# Patient Record
Sex: Male | Born: 1980
Health system: Southern US, Community
[De-identification: ages and names within clinical notes are randomized; demographics above are authoritative.]

## PROBLEM LIST (undated history)

## (undated) ENCOUNTER — Emergency Department (HOSPITAL_COMMUNITY): Admission: EM | Disposition: A | Discharge status: Home / Self Care | Payer: Self-pay

## (undated) DIAGNOSIS — Z21 Asymptomatic human immunodeficiency virus [HIV] infection status: Secondary | ICD-10-CM

## (undated) DIAGNOSIS — F640 Transsexualism: Secondary | ICD-10-CM

## (undated) DIAGNOSIS — B20 Human immunodeficiency virus [HIV] disease: Secondary | ICD-10-CM

## (undated) DIAGNOSIS — Z789 Other specified health status: Secondary | ICD-10-CM

---

## 2005-11-12 ENCOUNTER — Emergency Department (HOSPITAL_COMMUNITY): Admission: EM | Admit: 2005-11-12 | Discharge: 2005-11-13 | Payer: Self-pay | Admitting: Emergency Medicine

## 2006-03-29 ENCOUNTER — Encounter (INDEPENDENT_AMBULATORY_CARE_PROVIDER_SITE_OTHER): Payer: Self-pay | Admitting: *Deleted

## 2006-03-29 ENCOUNTER — Encounter: Admission: RE | Admit: 2006-03-29 | Discharge: 2006-03-29 | Payer: Self-pay | Admitting: Internal Medicine

## 2006-03-29 ENCOUNTER — Ambulatory Visit: Payer: Self-pay | Admitting: Internal Medicine

## 2006-03-29 LAB — CONVERTED CEMR LAB: HIV 1 RNA Quant: 115000 copies/mL

## 2006-04-23 ENCOUNTER — Ambulatory Visit: Payer: Self-pay | Admitting: Internal Medicine

## 2006-05-11 DIAGNOSIS — R599 Enlarged lymph nodes, unspecified: Secondary | ICD-10-CM | POA: Insufficient documentation

## 2006-05-11 DIAGNOSIS — B20 Human immunodeficiency virus [HIV] disease: Secondary | ICD-10-CM

## 2006-05-11 DIAGNOSIS — L089 Local infection of the skin and subcutaneous tissue, unspecified: Secondary | ICD-10-CM | POA: Insufficient documentation

## 2006-06-14 ENCOUNTER — Encounter (INDEPENDENT_AMBULATORY_CARE_PROVIDER_SITE_OTHER): Payer: Self-pay | Admitting: *Deleted

## 2006-06-14 LAB — CONVERTED CEMR LAB: CD4 Count: 0 microliters

## 2006-06-16 ENCOUNTER — Encounter: Admission: RE | Admit: 2006-06-16 | Discharge: 2006-06-16 | Payer: Self-pay | Admitting: Internal Medicine

## 2006-06-16 ENCOUNTER — Ambulatory Visit: Payer: Self-pay | Admitting: Internal Medicine

## 2006-06-16 LAB — CONVERTED CEMR LAB
AST: 22 units/L (ref 0–37)
Alkaline Phosphatase: 116 units/L (ref 39–117)
BUN: 12 mg/dL (ref 6–23)
Basophils Relative: 1 % (ref 0–1)
CD4 % Helper T Cell: 23 %
CD4 Count: 330 microliters
Calcium: 9.1 mg/dL (ref 8.4–10.5)
Creatinine, Ser: 0.89 mg/dL (ref 0.40–1.50)
Eosinophils Absolute: 0.3 10*3/uL (ref 0.0–0.7)
HCT: 44.8 % (ref 39.0–52.0)
Hemoglobin: 14.7 g/dL (ref 13.0–17.0)
MCHC: 32.8 g/dL (ref 30.0–36.0)
MCV: 98.7 fL (ref 78.0–100.0)
Monocytes Absolute: 0.6 10*3/uL (ref 0.2–0.7)
Monocytes Relative: 11 % (ref 3–11)
RBC: 4.54 M/uL (ref 4.22–5.81)

## 2006-06-22 ENCOUNTER — Telehealth (INDEPENDENT_AMBULATORY_CARE_PROVIDER_SITE_OTHER): Payer: Self-pay | Admitting: Infectious Diseases

## 2006-06-24 ENCOUNTER — Telehealth (INDEPENDENT_AMBULATORY_CARE_PROVIDER_SITE_OTHER): Payer: Self-pay | Admitting: *Deleted

## 2006-06-27 ENCOUNTER — Encounter (INDEPENDENT_AMBULATORY_CARE_PROVIDER_SITE_OTHER): Payer: Self-pay | Admitting: *Deleted

## 2006-07-02 ENCOUNTER — Ambulatory Visit: Payer: Self-pay | Admitting: Internal Medicine

## 2006-07-20 ENCOUNTER — Telehealth: Payer: Self-pay | Admitting: Internal Medicine

## 2006-08-16 ENCOUNTER — Encounter: Admission: RE | Admit: 2006-08-16 | Discharge: 2006-08-16 | Payer: Self-pay | Admitting: Internal Medicine

## 2006-08-16 ENCOUNTER — Ambulatory Visit: Payer: Self-pay | Admitting: Internal Medicine

## 2006-08-16 LAB — CONVERTED CEMR LAB
ALT: 26 units/L (ref 0–53)
Albumin: 4.3 g/dL (ref 3.5–5.2)
CO2: 24 meq/L (ref 19–32)
Calcium: 8.9 mg/dL (ref 8.4–10.5)
Chloride: 106 meq/L (ref 96–112)
Eosinophils Relative: 8 % — ABNORMAL HIGH (ref 0–5)
Glucose, Bld: 87 mg/dL (ref 70–99)
HCT: 41.5 % (ref 39.0–52.0)
HIV 1 RNA Quant: 797 copies/mL — ABNORMAL HIGH (ref <50)
HIV-1 RNA Quant, Log: 2.9 — ABNORMAL HIGH (ref <1.70)
Hemoglobin: 14 g/dL (ref 13.0–17.0)
Lymphocytes Relative: 34 % (ref 12–46)
Lymphs Abs: 1.7 10*3/uL (ref 0.7–3.3)
Neutro Abs: 2.4 10*3/uL (ref 1.7–7.7)
Platelets: 245 10*3/uL (ref 150–400)
Potassium: 4.1 meq/L (ref 3.5–5.3)
Sodium: 141 meq/L (ref 135–145)
Total Bilirubin: 0.7 mg/dL (ref 0.3–1.2)
Total Protein: 7.5 g/dL (ref 6.0–8.3)
WBC: 5 10*3/uL (ref 4.0–10.5)

## 2006-08-17 ENCOUNTER — Telehealth: Payer: Self-pay | Admitting: Internal Medicine

## 2006-08-31 ENCOUNTER — Ambulatory Visit: Payer: Self-pay | Admitting: Internal Medicine

## 2006-08-31 DIAGNOSIS — J309 Allergic rhinitis, unspecified: Secondary | ICD-10-CM | POA: Insufficient documentation

## 2006-09-17 ENCOUNTER — Telehealth: Payer: Self-pay | Admitting: Internal Medicine

## 2006-10-15 ENCOUNTER — Telehealth: Payer: Self-pay | Admitting: Internal Medicine

## 2006-10-29 ENCOUNTER — Telehealth: Payer: Self-pay | Admitting: Internal Medicine

## 2006-11-12 ENCOUNTER — Telehealth: Payer: Self-pay | Admitting: Internal Medicine

## 2006-11-18 ENCOUNTER — Ambulatory Visit: Payer: Self-pay | Admitting: Internal Medicine

## 2006-11-18 ENCOUNTER — Encounter: Admission: RE | Admit: 2006-11-18 | Discharge: 2006-11-18 | Payer: Self-pay | Admitting: Internal Medicine

## 2006-11-18 LAB — CONVERTED CEMR LAB
ALT: 27 units/L (ref 0–53)
AST: 18 units/L (ref 0–37)
Albumin: 4.2 g/dL (ref 3.5–5.2)
Alkaline Phosphatase: 133 units/L — ABNORMAL HIGH (ref 39–117)
Basophils Absolute: 0 10*3/uL (ref 0.0–0.1)
Basophils Relative: 1 % (ref 0–1)
Calcium: 8.8 mg/dL (ref 8.4–10.5)
Chloride: 106 meq/L (ref 96–112)
Eosinophils Absolute: 0.5 10*3/uL (ref 0.0–0.7)
MCHC: 33.3 g/dL (ref 30.0–36.0)
MCV: 102.7 fL — ABNORMAL HIGH (ref 78.0–100.0)
Neutro Abs: 3 10*3/uL (ref 1.7–7.7)
Neutrophils Relative %: 52 % (ref 43–77)
Platelets: 235 10*3/uL (ref 150–400)
Potassium: 4 meq/L (ref 3.5–5.3)
RDW: 11.5 % (ref 11.5–14.0)

## 2006-11-20 ENCOUNTER — Emergency Department (HOSPITAL_COMMUNITY): Admission: EM | Admit: 2006-11-20 | Discharge: 2006-11-20 | Payer: Self-pay | Admitting: Emergency Medicine

## 2006-11-23 ENCOUNTER — Telehealth: Payer: Self-pay | Admitting: Internal Medicine

## 2006-11-24 ENCOUNTER — Encounter: Payer: Self-pay | Admitting: Internal Medicine

## 2006-12-03 ENCOUNTER — Ambulatory Visit: Payer: Self-pay | Admitting: Internal Medicine

## 2006-12-07 ENCOUNTER — Telehealth: Payer: Self-pay | Admitting: Internal Medicine

## 2006-12-08 ENCOUNTER — Telehealth: Payer: Self-pay | Admitting: Internal Medicine

## 2007-01-05 ENCOUNTER — Telehealth: Payer: Self-pay | Admitting: Internal Medicine

## 2007-01-12 ENCOUNTER — Ambulatory Visit: Payer: Self-pay | Admitting: Internal Medicine

## 2007-01-12 ENCOUNTER — Encounter: Admission: RE | Admit: 2007-01-12 | Discharge: 2007-01-12 | Payer: Self-pay | Admitting: Internal Medicine

## 2007-01-12 LAB — CONVERTED CEMR LAB
AST: 20 units/L (ref 0–37)
Alkaline Phosphatase: 132 units/L — ABNORMAL HIGH (ref 39–117)
BUN: 15 mg/dL (ref 6–23)
Basophils Relative: 0 % (ref 0–1)
Eosinophils Absolute: 0.3 10*3/uL (ref 0.0–0.7)
Eosinophils Relative: 5 % (ref 0–5)
Glucose, Bld: 83 mg/dL (ref 70–99)
HCT: 41.6 % (ref 39.0–52.0)
HIV-1 RNA Quant, Log: 2.23 — ABNORMAL HIGH (ref <1.70)
Lymphs Abs: 1.4 10*3/uL (ref 0.7–3.3)
MCHC: 33.2 g/dL (ref 30.0–36.0)
MCV: 106.4 fL — ABNORMAL HIGH (ref 78.0–100.0)
Monocytes Relative: 8 % (ref 3–11)
Neutrophils Relative %: 60 % (ref 43–77)
Platelets: 268 10*3/uL (ref 150–400)
Potassium: 3.9 meq/L (ref 3.5–5.3)
RBC: 3.91 M/uL — ABNORMAL LOW (ref 4.22–5.81)
Total Bilirubin: 0.4 mg/dL (ref 0.3–1.2)
WBC: 5.3 10*3/uL (ref 4.0–10.5)

## 2007-01-26 ENCOUNTER — Ambulatory Visit: Payer: Self-pay | Admitting: Internal Medicine

## 2007-01-26 DIAGNOSIS — K029 Dental caries, unspecified: Secondary | ICD-10-CM | POA: Insufficient documentation

## 2007-02-08 ENCOUNTER — Telehealth: Payer: Self-pay | Admitting: Internal Medicine

## 2007-03-07 ENCOUNTER — Telehealth: Payer: Self-pay | Admitting: Internal Medicine

## 2007-04-07 ENCOUNTER — Telehealth: Payer: Self-pay | Admitting: Internal Medicine

## 2007-04-18 ENCOUNTER — Encounter: Payer: Self-pay | Admitting: Internal Medicine

## 2007-04-19 ENCOUNTER — Encounter (INDEPENDENT_AMBULATORY_CARE_PROVIDER_SITE_OTHER): Payer: Self-pay | Admitting: *Deleted

## 2007-04-29 ENCOUNTER — Telehealth: Payer: Self-pay | Admitting: Internal Medicine

## 2007-05-02 ENCOUNTER — Encounter: Admission: RE | Admit: 2007-05-02 | Discharge: 2007-05-02 | Payer: Self-pay | Admitting: Internal Medicine

## 2007-05-02 ENCOUNTER — Ambulatory Visit: Payer: Self-pay | Admitting: Internal Medicine

## 2007-05-02 LAB — CONVERTED CEMR LAB
BUN: 12 mg/dL (ref 6–23)
CO2: 22 meq/L (ref 19–32)
Eosinophils Absolute: 0.4 10*3/uL (ref 0.0–0.7)
Eosinophils Relative: 6 % — ABNORMAL HIGH (ref 0–5)
Glucose, Bld: 84 mg/dL (ref 70–99)
HCT: 39.4 % (ref 39.0–52.0)
HIV 1 RNA Quant: 119 copies/mL — ABNORMAL HIGH (ref <50)
HIV-1 RNA Quant, Log: 2.08 — ABNORMAL HIGH (ref <1.70)
Hemoglobin: 14 g/dL (ref 13.0–17.0)
Lymphocytes Relative: 33 % (ref 12–46)
Lymphs Abs: 1.9 10*3/uL (ref 0.7–4.0)
MCV: 119.8 fL — ABNORMAL HIGH (ref 78.0–100.0)
Monocytes Absolute: 0.5 10*3/uL (ref 0.1–1.0)
Monocytes Relative: 8 % (ref 3–12)
Platelets: 284 10*3/uL (ref 150–400)
RBC: 3.29 M/uL — ABNORMAL LOW (ref 4.22–5.81)
Sodium: 140 meq/L (ref 135–145)
Total Bilirubin: 0.5 mg/dL (ref 0.3–1.2)
Total Protein: 7.6 g/dL (ref 6.0–8.3)
WBC: 5.9 10*3/uL (ref 4.0–10.5)

## 2007-05-10 ENCOUNTER — Telehealth: Payer: Self-pay | Admitting: Internal Medicine

## 2007-05-17 ENCOUNTER — Ambulatory Visit: Payer: Self-pay | Admitting: Internal Medicine

## 2007-05-17 ENCOUNTER — Telehealth: Payer: Self-pay | Admitting: Internal Medicine

## 2007-06-09 ENCOUNTER — Telehealth: Payer: Self-pay | Admitting: Internal Medicine

## 2007-06-10 ENCOUNTER — Encounter (INDEPENDENT_AMBULATORY_CARE_PROVIDER_SITE_OTHER): Payer: Self-pay | Admitting: *Deleted

## 2007-06-10 ENCOUNTER — Telehealth: Payer: Self-pay | Admitting: Internal Medicine

## 2007-06-14 ENCOUNTER — Ambulatory Visit: Payer: Self-pay | Admitting: Internal Medicine

## 2007-06-14 DIAGNOSIS — B351 Tinea unguium: Secondary | ICD-10-CM

## 2007-06-22 ENCOUNTER — Telehealth (INDEPENDENT_AMBULATORY_CARE_PROVIDER_SITE_OTHER): Payer: Self-pay | Admitting: *Deleted

## 2007-07-01 ENCOUNTER — Encounter (INDEPENDENT_AMBULATORY_CARE_PROVIDER_SITE_OTHER): Payer: Self-pay | Admitting: *Deleted

## 2007-07-05 ENCOUNTER — Telehealth: Payer: Self-pay | Admitting: Internal Medicine

## 2007-08-04 ENCOUNTER — Telehealth (INDEPENDENT_AMBULATORY_CARE_PROVIDER_SITE_OTHER): Payer: Self-pay | Admitting: *Deleted

## 2007-09-02 ENCOUNTER — Telehealth (INDEPENDENT_AMBULATORY_CARE_PROVIDER_SITE_OTHER): Payer: Self-pay | Admitting: *Deleted

## 2007-09-23 ENCOUNTER — Ambulatory Visit: Payer: Self-pay | Admitting: Internal Medicine

## 2007-09-23 ENCOUNTER — Encounter: Admission: RE | Admit: 2007-09-23 | Discharge: 2007-09-23 | Payer: Self-pay | Admitting: Internal Medicine

## 2007-09-23 LAB — CONVERTED CEMR LAB
ALT: 27 units/L (ref 0–53)
Basophils Absolute: 0 10*3/uL (ref 0.0–0.1)
CO2: 24 meq/L (ref 19–32)
Calcium: 8.9 mg/dL (ref 8.4–10.5)
Chloride: 105 meq/L (ref 96–112)
HIV 1 RNA Quant: 342 copies/mL — ABNORMAL HIGH (ref <50)
Hemoglobin: 12.8 g/dL — ABNORMAL LOW (ref 13.0–17.0)
Lymphocytes Relative: 29 % (ref 12–46)
Lymphs Abs: 1.9 10*3/uL (ref 0.7–4.0)
Monocytes Absolute: 0.4 10*3/uL (ref 0.1–1.0)
Neutro Abs: 3.8 10*3/uL (ref 1.7–7.7)
Platelets: 292 10*3/uL (ref 150–400)
RDW: 11.6 % (ref 11.5–15.5)
Sodium: 140 meq/L (ref 135–145)
Total Protein: 7.3 g/dL (ref 6.0–8.3)
WBC: 6.6 10*3/uL (ref 4.0–10.5)

## 2007-10-03 ENCOUNTER — Telehealth (INDEPENDENT_AMBULATORY_CARE_PROVIDER_SITE_OTHER): Payer: Self-pay | Admitting: *Deleted

## 2007-10-07 ENCOUNTER — Ambulatory Visit: Payer: Self-pay | Admitting: Internal Medicine

## 2007-11-01 ENCOUNTER — Telehealth (INDEPENDENT_AMBULATORY_CARE_PROVIDER_SITE_OTHER): Payer: Self-pay | Admitting: *Deleted

## 2007-11-30 ENCOUNTER — Telehealth (INDEPENDENT_AMBULATORY_CARE_PROVIDER_SITE_OTHER): Payer: Self-pay | Admitting: *Deleted

## 2007-12-01 ENCOUNTER — Telehealth: Payer: Self-pay | Admitting: Internal Medicine

## 2008-01-02 ENCOUNTER — Telehealth (INDEPENDENT_AMBULATORY_CARE_PROVIDER_SITE_OTHER): Payer: Self-pay | Admitting: *Deleted

## 2008-01-05 ENCOUNTER — Ambulatory Visit: Payer: Self-pay | Admitting: Internal Medicine

## 2008-01-05 ENCOUNTER — Telehealth: Payer: Self-pay | Admitting: Internal Medicine

## 2008-01-05 LAB — CONVERTED CEMR LAB
ALT: 21 units/L (ref 0–53)
AST: 15 units/L (ref 0–37)
Basophils Absolute: 0 10*3/uL (ref 0.0–0.1)
Basophils Relative: 0 % (ref 0–1)
Calcium: 9 mg/dL (ref 8.4–10.5)
Chloride: 105 meq/L (ref 96–112)
Creatinine, Ser: 0.88 mg/dL (ref 0.40–1.50)
HIV-1 RNA Quant, Log: 2.41 — ABNORMAL HIGH (ref <1.70)
MCHC: 33 g/dL (ref 30.0–36.0)
Neutro Abs: 6.5 10*3/uL (ref 1.7–7.7)
Neutrophils Relative %: 61 % (ref 43–77)
Platelets: 267 10*3/uL (ref 150–400)
Potassium: 3.6 meq/L (ref 3.5–5.3)
RDW: 11.9 % (ref 11.5–15.5)

## 2008-01-06 ENCOUNTER — Ambulatory Visit: Payer: Self-pay | Admitting: Internal Medicine

## 2008-01-06 DIAGNOSIS — L0233 Carbuncle of buttock: Secondary | ICD-10-CM

## 2008-01-20 ENCOUNTER — Ambulatory Visit: Payer: Self-pay | Admitting: Internal Medicine

## 2008-01-26 ENCOUNTER — Telehealth (INDEPENDENT_AMBULATORY_CARE_PROVIDER_SITE_OTHER): Payer: Self-pay | Admitting: *Deleted

## 2008-02-29 ENCOUNTER — Telehealth (INDEPENDENT_AMBULATORY_CARE_PROVIDER_SITE_OTHER): Payer: Self-pay | Admitting: *Deleted

## 2008-03-28 ENCOUNTER — Telehealth (INDEPENDENT_AMBULATORY_CARE_PROVIDER_SITE_OTHER): Payer: Self-pay | Admitting: *Deleted

## 2008-04-26 ENCOUNTER — Telehealth (INDEPENDENT_AMBULATORY_CARE_PROVIDER_SITE_OTHER): Payer: Self-pay | Admitting: *Deleted

## 2008-04-30 ENCOUNTER — Encounter (INDEPENDENT_AMBULATORY_CARE_PROVIDER_SITE_OTHER): Payer: Self-pay | Admitting: Licensed Clinical Social Worker

## 2008-05-04 ENCOUNTER — Ambulatory Visit: Payer: Self-pay | Admitting: Internal Medicine

## 2008-05-04 LAB — CONVERTED CEMR LAB
BUN: 14 mg/dL (ref 6–23)
Basophils Absolute: 0 10*3/uL (ref 0.0–0.1)
CO2: 24 meq/L (ref 19–32)
Creatinine, Ser: 0.99 mg/dL (ref 0.40–1.50)
Eosinophils Relative: 10 % — ABNORMAL HIGH (ref 0–5)
Glucose, Bld: 88 mg/dL (ref 70–99)
HCT: 40.4 % (ref 39.0–52.0)
HIV 1 RNA Quant: 1400 copies/mL — ABNORMAL HIGH (ref <48)
HIV-1 RNA Quant, Log: 3.15 — ABNORMAL HIGH (ref <1.68)
Hemoglobin: 13.6 g/dL (ref 13.0–17.0)
Lymphocytes Relative: 41 % (ref 12–46)
Lymphs Abs: 2.7 10*3/uL (ref 0.7–4.0)
Monocytes Absolute: 0.5 10*3/uL (ref 0.1–1.0)
Monocytes Relative: 8 % (ref 3–12)
RDW: 11.2 % — ABNORMAL LOW (ref 11.5–15.5)
Total Bilirubin: 0.4 mg/dL (ref 0.3–1.2)

## 2008-05-15 ENCOUNTER — Telehealth (INDEPENDENT_AMBULATORY_CARE_PROVIDER_SITE_OTHER): Payer: Self-pay | Admitting: Licensed Clinical Social Worker

## 2008-05-15 ENCOUNTER — Emergency Department (HOSPITAL_COMMUNITY): Admission: EM | Admit: 2008-05-15 | Discharge: 2008-05-15 | Payer: Self-pay | Admitting: Emergency Medicine

## 2008-05-23 ENCOUNTER — Ambulatory Visit: Payer: Self-pay | Admitting: Internal Medicine

## 2008-05-23 LAB — CONVERTED CEMR LAB

## 2008-05-24 ENCOUNTER — Telehealth (INDEPENDENT_AMBULATORY_CARE_PROVIDER_SITE_OTHER): Payer: Self-pay | Admitting: *Deleted

## 2008-06-13 ENCOUNTER — Ambulatory Visit: Payer: Self-pay | Admitting: Internal Medicine

## 2008-06-19 ENCOUNTER — Telehealth (INDEPENDENT_AMBULATORY_CARE_PROVIDER_SITE_OTHER): Payer: Self-pay | Admitting: *Deleted

## 2008-06-20 ENCOUNTER — Encounter (INDEPENDENT_AMBULATORY_CARE_PROVIDER_SITE_OTHER): Payer: Self-pay | Admitting: *Deleted

## 2008-07-11 ENCOUNTER — Encounter (INDEPENDENT_AMBULATORY_CARE_PROVIDER_SITE_OTHER): Payer: Self-pay | Admitting: *Deleted

## 2008-07-23 ENCOUNTER — Telehealth (INDEPENDENT_AMBULATORY_CARE_PROVIDER_SITE_OTHER): Payer: Self-pay | Admitting: *Deleted

## 2008-07-26 ENCOUNTER — Ambulatory Visit: Payer: Self-pay | Admitting: Internal Medicine

## 2008-07-26 LAB — CONVERTED CEMR LAB
AST: 12 units/L (ref 0–37)
Albumin: 4.4 g/dL (ref 3.5–5.2)
Alkaline Phosphatase: 106 units/L (ref 39–117)
Eosinophils Absolute: 0.4 10*3/uL (ref 0.0–0.7)
Eosinophils Relative: 6 % — ABNORMAL HIGH (ref 0–5)
HCT: 40.1 % (ref 39.0–52.0)
Hemoglobin: 13.8 g/dL (ref 13.0–17.0)
Lymphs Abs: 1.9 10*3/uL (ref 0.7–4.0)
MCV: 109.6 fL — ABNORMAL HIGH (ref 78.0–100.0)
Monocytes Absolute: 0.5 10*3/uL (ref 0.1–1.0)
Platelets: 240 10*3/uL (ref 150–400)
Potassium: 3.8 meq/L (ref 3.5–5.3)
RDW: 11.7 % (ref 11.5–15.5)
Sodium: 139 meq/L (ref 135–145)
Total Protein: 7.2 g/dL (ref 6.0–8.3)

## 2008-08-15 ENCOUNTER — Telehealth (INDEPENDENT_AMBULATORY_CARE_PROVIDER_SITE_OTHER): Payer: Self-pay | Admitting: *Deleted

## 2008-08-24 ENCOUNTER — Ambulatory Visit: Payer: Self-pay | Admitting: Internal Medicine

## 2008-09-11 ENCOUNTER — Telehealth (INDEPENDENT_AMBULATORY_CARE_PROVIDER_SITE_OTHER): Payer: Self-pay | Admitting: *Deleted

## 2008-09-13 ENCOUNTER — Emergency Department (HOSPITAL_COMMUNITY): Admission: EM | Admit: 2008-09-13 | Discharge: 2008-09-13 | Payer: Self-pay | Admitting: Emergency Medicine

## 2008-10-05 ENCOUNTER — Ambulatory Visit: Payer: Self-pay | Admitting: Internal Medicine

## 2008-10-05 LAB — CONVERTED CEMR LAB
ALT: 9 units/L (ref 0–53)
Albumin: 4.2 g/dL (ref 3.5–5.2)
Basophils Absolute: 0 10*3/uL (ref 0.0–0.1)
Basophils Relative: 0 % (ref 0–1)
CO2: 24 meq/L (ref 19–32)
Calcium: 9 mg/dL (ref 8.4–10.5)
Chloride: 107 meq/L (ref 96–112)
GFR calc Af Amer: >60 mL/min (ref >60)
GFR calc non Af Amer: >60 mL/min (ref >60)
Glucose, Bld: 90 mg/dL (ref 70–99)
HIV 1 RNA Quant: 130 copies/mL — ABNORMAL HIGH (ref <48)
HIV-1 RNA Quant, Log: 2.11 — ABNORMAL HIGH (ref <1.68)
Hemoglobin: 13.5 g/dL (ref 13.0–17.0)
Lymphocytes Relative: 29 % (ref 12–46)
MCHC: 32.3 g/dL (ref 30.0–36.0)
Neutro Abs: 3.9 10*3/uL (ref 1.7–7.7)
Neutrophils Relative %: 60 % (ref 43–77)
Platelets: 236 10*3/uL (ref 150–400)
RDW: 12.1 % (ref 11.5–15.5)
Sodium: 139 meq/L (ref 135–145)
Total Bilirubin: 0.8 mg/dL (ref 0.3–1.2)
Total Protein: 7.1 g/dL (ref 6.0–8.3)

## 2008-10-16 ENCOUNTER — Telehealth (INDEPENDENT_AMBULATORY_CARE_PROVIDER_SITE_OTHER): Payer: Self-pay | Admitting: *Deleted

## 2008-10-19 ENCOUNTER — Ambulatory Visit: Payer: Self-pay | Admitting: Internal Medicine

## 2008-10-19 DIAGNOSIS — M25559 Pain in unspecified hip: Secondary | ICD-10-CM

## 2008-11-12 ENCOUNTER — Telehealth (INDEPENDENT_AMBULATORY_CARE_PROVIDER_SITE_OTHER): Payer: Self-pay | Admitting: *Deleted

## 2008-12-07 ENCOUNTER — Telehealth (INDEPENDENT_AMBULATORY_CARE_PROVIDER_SITE_OTHER): Payer: Self-pay | Admitting: *Deleted

## 2009-01-08 ENCOUNTER — Telehealth (INDEPENDENT_AMBULATORY_CARE_PROVIDER_SITE_OTHER): Payer: Self-pay | Admitting: *Deleted

## 2009-01-24 ENCOUNTER — Ambulatory Visit: Payer: Self-pay | Admitting: Internal Medicine

## 2009-01-24 LAB — CONVERTED CEMR LAB
ALT: 11 units/L (ref 0–53)
AST: 13 units/L (ref 0–37)
BUN: 10 mg/dL (ref 6–23)
Basophils Absolute: 0 10*3/uL (ref 0.0–0.1)
Calcium: 9.2 mg/dL (ref 8.4–10.5)
Chloride: 106 meq/L (ref 96–112)
Creatinine, Ser: 1.02 mg/dL (ref 0.40–1.50)
Eosinophils Absolute: 0.6 10*3/uL (ref 0.0–0.7)
Eosinophils Relative: 6 % — ABNORMAL HIGH (ref 0–5)
HCT: 44.7 % (ref 39.0–52.0)
HIV-1 RNA Quant, Log: 1.87 — ABNORMAL HIGH (ref <1.68)
Hemoglobin: 14.4 g/dL (ref 13.0–17.0)
Lymphocytes Relative: 24 % (ref 12–46)
Lymphs Abs: 2.4 10*3/uL (ref 0.7–4.0)
MCV: 105.4 fL — ABNORMAL HIGH (ref >=78.0)
Monocytes Absolute: 0.9 10*3/uL (ref 0.1–1.0)
Platelets: 239 10*3/uL (ref 150–400)
RDW: 11.8 % (ref 11.5–15.5)
Total Bilirubin: 0.4 mg/dL (ref 0.3–1.2)

## 2009-02-06 ENCOUNTER — Telehealth (INDEPENDENT_AMBULATORY_CARE_PROVIDER_SITE_OTHER): Payer: Self-pay | Admitting: *Deleted

## 2009-02-06 ENCOUNTER — Ambulatory Visit: Payer: Self-pay | Admitting: Internal Medicine

## 2009-03-05 ENCOUNTER — Telehealth (INDEPENDENT_AMBULATORY_CARE_PROVIDER_SITE_OTHER): Payer: Self-pay | Admitting: *Deleted

## 2009-04-08 ENCOUNTER — Telehealth (INDEPENDENT_AMBULATORY_CARE_PROVIDER_SITE_OTHER): Payer: Self-pay | Admitting: *Deleted

## 2009-05-02 ENCOUNTER — Ambulatory Visit: Payer: Self-pay | Admitting: Internal Medicine

## 2009-05-03 ENCOUNTER — Encounter: Payer: Self-pay | Admitting: Internal Medicine

## 2009-05-03 LAB — CONVERTED CEMR LAB
AST: 17 units/L (ref 0–37)
Albumin: 4.6 g/dL (ref 3.5–5.2)
BUN: 19 mg/dL (ref 6–23)
CO2: 24 meq/L (ref 19–32)
Calcium: 9.4 mg/dL (ref 8.4–10.5)
Chloride: 107 meq/L (ref 96–112)
Cholesterol: 193 mg/dL (ref 0–200)
Eosinophils Absolute: 0.4 10*3/uL (ref 0.0–0.7)
Eosinophils Relative: 6 % — ABNORMAL HIGH (ref 0–5)
Glucose, Bld: 90 mg/dL (ref 70–99)
HCT: 43.2 % (ref 39.0–52.0)
HDL: 45 mg/dL (ref >39)
Hemoglobin: 14.3 g/dL (ref 13.0–17.0)
Lymphocytes Relative: 28 % (ref 12–46)
Lymphs Abs: 2 10*3/uL (ref 0.7–4.0)
MCV: 105.9 fL — ABNORMAL HIGH (ref >=78.0)
Monocytes Absolute: 0.5 10*3/uL (ref 0.1–1.0)
Monocytes Relative: 7 % (ref 3–12)
Potassium: 4.3 meq/L (ref 3.5–5.3)
Triglycerides: 53 mg/dL (ref <150)
WBC: 7.1 10*3/uL (ref 4.0–10.5)

## 2009-05-06 ENCOUNTER — Telehealth (INDEPENDENT_AMBULATORY_CARE_PROVIDER_SITE_OTHER): Payer: Self-pay | Admitting: *Deleted

## 2009-05-17 ENCOUNTER — Ambulatory Visit: Payer: Self-pay | Admitting: Internal Medicine

## 2009-05-20 ENCOUNTER — Telehealth (INDEPENDENT_AMBULATORY_CARE_PROVIDER_SITE_OTHER): Payer: Self-pay | Admitting: Licensed Clinical Social Worker

## 2009-05-20 LAB — CONVERTED CEMR LAB
Chlamydia, Swab/Urine, PCR: NEGATIVE
GC Probe Amp, Urine: NEGATIVE

## 2009-06-01 ENCOUNTER — Telehealth (INDEPENDENT_AMBULATORY_CARE_PROVIDER_SITE_OTHER): Payer: Self-pay | Admitting: *Deleted

## 2009-07-01 ENCOUNTER — Telehealth (INDEPENDENT_AMBULATORY_CARE_PROVIDER_SITE_OTHER): Payer: Self-pay | Admitting: *Deleted

## 2009-07-10 ENCOUNTER — Encounter (INDEPENDENT_AMBULATORY_CARE_PROVIDER_SITE_OTHER): Payer: Self-pay | Admitting: *Deleted

## 2009-08-15 ENCOUNTER — Encounter (INDEPENDENT_AMBULATORY_CARE_PROVIDER_SITE_OTHER): Payer: Self-pay | Admitting: *Deleted

## 2009-08-15 ENCOUNTER — Ambulatory Visit: Payer: Self-pay | Admitting: Internal Medicine

## 2009-08-15 LAB — CONVERTED CEMR LAB
ALT: 12 units/L (ref 0–53)
Alkaline Phosphatase: 93 units/L (ref 39–117)
Basophils Absolute: 0 10*3/uL (ref 0.0–0.1)
Basophils Relative: 1 % (ref 0–1)
CO2: 26 meq/L (ref 19–32)
MCHC: 32.6 g/dL (ref 30.0–36.0)
Monocytes Relative: 10 % (ref 3–12)
Neutro Abs: 3.9 10*3/uL (ref 1.7–7.7)
Neutrophils Relative %: 57 % (ref 43–77)
RBC: 4.25 M/uL (ref 4.22–5.81)
Sodium: 139 meq/L (ref 135–145)
Total Bilirubin: 0.4 mg/dL (ref 0.3–1.2)
Total Protein: 7.5 g/dL (ref 6.0–8.3)

## 2009-08-16 ENCOUNTER — Telehealth: Payer: Self-pay | Admitting: Internal Medicine

## 2009-08-30 ENCOUNTER — Ambulatory Visit: Payer: Self-pay | Admitting: Internal Medicine

## 2009-08-30 DIAGNOSIS — R059 Cough, unspecified: Secondary | ICD-10-CM | POA: Insufficient documentation

## 2009-08-30 DIAGNOSIS — R05 Cough: Secondary | ICD-10-CM | POA: Insufficient documentation

## 2009-09-11 ENCOUNTER — Telehealth: Payer: Self-pay | Admitting: Internal Medicine

## 2009-10-17 ENCOUNTER — Telehealth: Payer: Self-pay | Admitting: Internal Medicine

## 2009-10-22 ENCOUNTER — Telehealth: Payer: Self-pay | Admitting: Internal Medicine

## 2009-11-15 ENCOUNTER — Telehealth: Payer: Self-pay | Admitting: Internal Medicine

## 2009-12-03 ENCOUNTER — Ambulatory Visit: Payer: Self-pay | Admitting: Internal Medicine

## 2009-12-03 LAB — CONVERTED CEMR LAB
ALT: 9 units/L (ref 0–53)
AST: 12 units/L (ref 0–37)
Basophils Absolute: 0 10*3/uL (ref 0.0–0.1)
Basophils Relative: 1 % (ref 0–1)
CO2: 20 meq/L (ref 19–32)
Creatinine, Ser: 1 mg/dL (ref 0.40–1.50)
Eosinophils Relative: 5 % (ref 0–5)
HCT: 42.5 % (ref 39.0–52.0)
HIV 1 RNA Quant: <48 copies/mL (ref <48)
Hemoglobin: 14 g/dL (ref 13.0–17.0)
MCHC: 32.9 g/dL (ref 30.0–36.0)
Monocytes Absolute: 0.7 10*3/uL (ref 0.1–1.0)
RDW: 11.6 % (ref 11.5–15.5)
Total Bilirubin: 0.5 mg/dL (ref 0.3–1.2)

## 2009-12-17 ENCOUNTER — Telehealth (INDEPENDENT_AMBULATORY_CARE_PROVIDER_SITE_OTHER): Payer: Self-pay | Admitting: *Deleted

## 2010-01-03 ENCOUNTER — Ambulatory Visit: Payer: Self-pay | Admitting: Internal Medicine

## 2010-01-03 DIAGNOSIS — K602 Anal fissure, unspecified: Secondary | ICD-10-CM | POA: Insufficient documentation

## 2010-01-10 ENCOUNTER — Telehealth: Payer: Self-pay | Admitting: Internal Medicine

## 2010-02-10 ENCOUNTER — Telehealth (INDEPENDENT_AMBULATORY_CARE_PROVIDER_SITE_OTHER): Payer: Self-pay | Admitting: *Deleted

## 2010-02-14 ENCOUNTER — Telehealth (INDEPENDENT_AMBULATORY_CARE_PROVIDER_SITE_OTHER): Payer: Self-pay | Admitting: *Deleted

## 2010-03-07 ENCOUNTER — Telehealth (INDEPENDENT_AMBULATORY_CARE_PROVIDER_SITE_OTHER): Payer: Self-pay | Admitting: *Deleted

## 2010-05-20 NOTE — Miscellaneous (Signed)
Summary: clinical update/ryan white [facility] appr til 07/19/10  Clinical Lists Changes  Observations: Added new observation of AIDSDAP: Yes 2011 (07/10/2009 8:40)

## 2010-05-20 NOTE — Assessment & Plan Note (Signed)
Summary: f/u ov/vs   CC:  follow-up visit and lab results.  History of Present Illness: Pt c/o pain in rectal area with some blood for the last few days. No mised doses of his HIV meds.  Preventive Screening-Counseling & Management  Alcohol-Tobacco     Alcohol drinks/day: one a day     Alcohol type: beer     Smoking Status: current     Smoking Cessation Counseling: yes     Smoke Cessation Stage: contemplative     Packs/Day: 0.5     Year Started: 10     Passive Smoke Exposure: no  Caffeine-Diet-Exercise     Caffeine use/day: occasional soda     Does Patient Exercise: yes     Type of exercise: walk     Exercise (avg: min/session): >60     Times/week: 5  Hep-HIV-STD-Contraception     HIV Risk: no risk noted  Safety-Violence-Falls     Seat Belt Use: yes      Sexual History:  dating.        Drug Use:  never.    Comments: pt given condoms   Updated Prior Medication List: PROMETHAZINE HCL 25 MG TABS (PROMETHAZINE HCL) 1 every 4-6 hours as needed nausea NORVIR 100 MG TABS (RITONAVIR) Take 1 tablet by mouth once a day PREZISTA 400 MG TABS (DARUNAVIR ETHANOLATE) Take 2 tablest by mouth once a day TRUVADA 200-300 MG TABS (EMTRICITABINE-TENOFOVIR) Take 1 tablet by mouth once a day DOXYCYCLINE HYCLATE 100 MG CAPS (DOXYCYCLINE HYCLATE) Take 1 capsule by mouth two times a day  Current Allergies (reviewed today): No known allergies  Past History:  Past Medical History: Last updated: 05/11/2006 HIV disease Recurrent skin infection Adenopathy  Review of Systems  The patient denies anorexia, fever, and weight loss.    Vital Signs:  Patient profile:   30 year old male Height:      72 inches (182.88 cm) Weight:      176.0 pounds (80 kg) BMI:     23.96 Temp:     98.2 degrees F (36.78 degrees C) oral Pulse rate:   84 / minute BP sitting:   115 / 61  (right arm)  Vitals Entered By: [name] CMA [facility]) (January 03, 2010 3:22 PM) CC: follow-up visit, lab  results Is Patient Diabetic? No Pain Assessment Patient in pain? no      Nutritional Status BMI of 19 -24 = normal Nutritional Status Detail appetite "good"  Does patient need assistance? Functional Status Self care Ambulation Normal Comments no missed doses of meds per patient   Physical Exam  General:  alert, well-developed, well-nourished, and well-hydrated.   Head:  normocephalic and atraumatic.   Lungs:  normal breath sounds.   Rectal:  anal fissure.      Impression & Recommendations:  Problem # 1:  HIV DISEASE (ICD-042) Pt.s most recent CD4ct was  640 and VL <48 .  Pt instructed to continue the current antiretroviral regimen.  Pt encouraged to take medication regularly and not miss doses.  Pt will f/u in 3 months for repeat blood work and will see me 2 weeks later. Influenza vaccine given.  Diagnostics Reviewed:  HIV: HIV positive - not AIDS (10/19/2008)   CD4: 640 (12/04/2009)   CD4 %: 23 (06/16/2006) WBC: 8.5 (12/03/2009)   Hgb: 14.0 (12/03/2009)   HCT: 42.5 (12/03/2009)   Platelets: 254 (12/03/2009) HIV genotype: See Comment (05/23/2008)   HIV-1 RNA: <48 copies/mL (12/03/2009)   HBSAg: NO (06/14/2006)  Problem # 2:  ANAL FISSURE (ICD-565.0) warm baths doxycycline for 10 days call if not improving  Medications Added to Medication List This Visit: 1)  Doxycycline Hyclate 100 Mg Caps (Doxycycline hyclate) .... Take 1 capsule by mouth two times a day  Other Orders: Influenza Vaccine NON MCR (66063) Est. Patient Level III (01601) Future Orders: T-CD4SP ([facility]) (CD4SP) ... 04/03/2010 T-HIV Viral Load [phone]) ... 04/03/2010 T-Comprehensive Metabolic Panel [phone]) ... 04/03/2010 T-CBC w/Diff ([id number]) ... 04/03/2010  Patient Instructions: 1)  Please schedule a follow-up appointment in 3 months, 2 weeks after labs.  Prescriptions: DOXYCYCLINE HYCLATE 100 MG CAPS (DOXYCYCLINE HYCLATE) Take 1 capsule by mouth two times a day  #20 x 0   Entered  and Authorized by:   Yisroel Ramming MD   Signed by:   Yisroel Ramming MD on 01/03/2010   Method used:   Print then Give to Patient   RxID:   [id number]    Immunizations Administered:  Influenza Vaccine # 1:    Vaccine Type: Fluvax Non-MCR    Site: right deltoid    Mfr: Novartis    Dose: 0.5 ml    Route: IM    Given by: [name] CMA ( AAMA)    Exp. Date: 07/20/2010    Lot #: [id number]    VIS given: 11/12/09 version given January 03, 2010.  Flu Vaccine Consent Questions:    Do you have a history of severe allergic reactions to this vaccine? no    Any prior history of allergic reactions to egg and/or gelatin? no    Do you have a sensitivity to the preservative Thimersol? no    Do you have a past history of Guillan-Barre Syndrome? no    Do you currently have an acute febrile illness? no    Have you ever had a severe reaction to latex? no    Vaccine information given and explained to patient? yes

## 2010-05-20 NOTE — Progress Notes (Signed)
Summary: samples given  Phone Note Call from Patient   Caller: Patient Summary of Call: Patient has not received his medciations.  He said he is only out of the Norvir and would like a sample of it.  I told him that [facility] has most likely tried to contact him to get him set up .  I will give him a sample of the Norvir as he requested and check for him with [facility] to get him set up for his medication.  Initial call taken by: Paulo Fruit  BS,CPht II,MPH,  October 17, 2009 3:14 PM  Follow-up for Phone Call        sample given  Sample Given, Lot #: Norvir 100mg  [id number] Expiration Date:20 Jun 2010 Patient has been instructed regarding the correct time, dose and frequency of taking this med, including desired effects and most common side effects.  Follow-up by: Paulo Fruit  BS,CPht II,MPH,  October 17, 2009 3:22 PM

## 2010-05-20 NOTE — Progress Notes (Signed)
Summary: [facility]/pt assist meds arrived for Jan  Phone Note Refill Request      Prescriptions: TRUVADA 200-300 MG TABS (EMTRICITABINE-TENOFOVIR) Take 1 tablet by mouth once a day  #30 x 0   Entered by:   Paulo Fruit  BS,CPht II,MPH   Authorized by:   Yisroel Ramming MD   Signed by:   Paulo Fruit  BS,CPht II,MPH on 05/06/2009   Method used:   Samples Given   RxID:   [id number] PREZISTA 400 MG TABS (DARUNAVIR ETHANOLATE) Take 2 tablest by mouth once a day  #60 x 0   Entered by:   Paulo Fruit  BS,CPht II,MPH   Authorized by:   Yisroel Ramming MD   Signed by:   Paulo Fruit  BS,CPht II,MPH on 05/06/2009   Method used:   Samples Given   RxID:   [id number] NORVIR 100 MG TABS (RITONAVIR) Take 1 tablet by mouth once a day  #30 x 0   Entered by:   Paulo Fruit  BS,CPht II,MPH   Authorized by:   Yisroel Ramming MD   Signed by:   Paulo Fruit  BS,CPht II,MPH on 05/06/2009   Method used:   Samples Given   RxID:   [id number]   Patient Assist Medication Verification: Medication: Truvada Lot# [id number] Exp Date:06 2014 Tech approval:MLD                Patient Assist Medication Verification: Medication:Prezista 400mg  GUY#[id number] Exp Date:11 2012 Tech approval:MLD                Patient Assist Medication Verification: Medication:Norvir 100mg  QVZ#[id number] Exp Date:28 Jul 2010 Tech approval:MLD Call placed to patient with message that assistance medications are ready for pick-up. Paulo Fruit  BS,CPht II,MPH  May 06, 2009 10:00 AM                   Appended Document: [facility]/pt assist meds arrived for Jan Prescription/Samples picked up by: patient

## 2010-05-20 NOTE — Progress Notes (Signed)
Summary: phone note-ty  Phone Note Outgoing Call   Call placed by: Starleen Arms CMA,  May 20, 2009 10:28 AM Call placed to: Patient Summary of Call: Let patient know that  std screening  were negative. Initial call taken by: Starleen Arms CMA,  May 20, 2009 10:29 AM

## 2010-05-20 NOTE — Progress Notes (Signed)
Summary: Patient picked up meds  Phone Note Call from Patient   Caller: Patient Summary of Call: Patient picked up meds on 02-14-2010.  Initial call taken by: Kathi Simpers [facility]),  February 14, 2010 3:08 PM

## 2010-05-20 NOTE — Assessment & Plan Note (Signed)
Summary: 43month f/u/vs   CC:  follow-up visit, lab results, and c/o cough x 2 days.  History of Present Illness: Pt had a slight cough for the last few days.  The cough is productive of clear sputum.  No fever or chills.  Preventive Screening-Counseling & Management  Alcohol-Tobacco     Alcohol drinks/day: one a day     Alcohol type: beer     Smoking Status: current     Smoking Cessation Counseling: yes     Smoke Cessation Stage: contemplative     Packs/Day: 0.5     Year Started: 10     Passive Smoke Exposure: no  Caffeine-Diet-Exercise     Caffeine use/day: 0     Does Patient Exercise: yes     Type of exercise: walk     Exercise (avg: min/session): >60     Times/week: 5  Hep-HIV-STD-Contraception     HIV Risk: no risk noted  Safety-Violence-Falls     Seat Belt Use: yes      Sexual History:  dating.        Drug Use:  never.    Comments: pt. given condoms   Updated Prior Medication List: PROMETHAZINE HCL 25 MG TABS (PROMETHAZINE HCL) 1 every 4-6 hours as needed nausea NORVIR 100 MG TABS (RITONAVIR) Take 1 tablet by mouth once a day PREZISTA 400 MG TABS (DARUNAVIR ETHANOLATE) Take 2 tablest by mouth once a day TRUVADA 200-300 MG TABS (EMTRICITABINE-TENOFOVIR) Take 1 tablet by mouth once a day  Current Allergies (reviewed today): No known allergies  Past History:  Past Medical History: Last updated: 05/11/2006 HIV disease Recurrent skin infection Adenopathy  Review of Systems  The patient denies anorexia, fever, weight loss, dyspnea on exertion, and hemoptysis.    Vital Signs:  Patient profile:   30 year old male Height:      72 inches (182.88 cm) Weight:      175.12 pounds (79.60 kg) BMI:     23.84 Temp:     97.9 degrees F (36.61 degrees C) oral Pulse rate:   56 / minute BP sitting:   104 / 62  (right arm)  Vitals Entered By: [name] CMA [facility]) (Aug 30, 2009 10:39 AM) CC: follow-up visit, lab results, c/o cough x 2 days Is Patient  Diabetic? No Pain Assessment Patient in pain? no      Nutritional Status BMI of 19 -24 = normal Nutritional Status Detail appetite "good"  Does patient need assistance? Functional Status Self care Ambulation Normal Comments two  missed doses of meds per patient   Physical Exam  General:  alert, well-developed, well-nourished, and well-hydrated.   Head:  normocephalic and atraumatic.   Mouth:  pharynx pink and moist.   Lungs:  normal breath sounds.      Impression & Recommendations:  Problem # 1:  HIV DISEASE (ICD-042) Pt.s most recent CD4ct was 590 and VL <48 .  Pt instructed to continue the current antiretroviral regimen.  Pt encouraged to take medication regularly and not miss doses.  Pt will f/u in 3 months for repeat blood work and will see me 2 weeks later.  Diagnostics Reviewed:  HIV: HIV positive - not AIDS (10/19/2008)   CD4: 590 (08/15/2009)   CD4 %: 23 (06/16/2006) WBC: 6.8 (08/15/2009)   Hgb: 14.6 (08/15/2009)   HCT: 44.8 (08/15/2009)   Platelets: 247 (08/15/2009) HIV genotype: See Comment (05/23/2008)   HIV-1 RNA: <48 copies/mL (08/15/2009)   HBSAg: NO (06/14/2006)  Problem #  2:  COUGH (ICD-786.2) most likely viral or allergy related mucinex for symptom relief.  Other Orders: Est. Patient Level III (60454) Future Orders: T-CD4SP ([facility]) (CD4SP) ... 11/28/2009 T-HIV Viral Load [phone]) ... 11/28/2009 T-Comprehensive Metabolic Panel [phone]) ... 11/28/2009 T-CBC w/Diff ([id number]) ... 11/28/2009  Patient Instructions: 1)  Please schedule a follow-up appointment in 3 months,2 weeks after labs.

## 2010-05-20 NOTE — Progress Notes (Signed)
Summary: [facility]/pt assist meds arrived for May  Phone Note Refill Request      Prescriptions: TRUVADA 200-300 MG TABS (EMTRICITABINE-TENOFOVIR) Take 1 tablet by mouth once a day  #30 x 0   Entered by:   Paulo Fruit  BS,CPht II,MPH   Authorized by:   Yisroel Ramming MD   Signed by:   Paulo Fruit  BS,CPht II,MPH on 09/11/2009   Method used:   Samples Given   RxID:   [id number] PREZISTA 400 MG TABS (DARUNAVIR ETHANOLATE) Take 2 tablest by mouth once a day  #60 x 0   Entered by:   Paulo Fruit  BS,CPht II,MPH   Authorized by:   Yisroel Ramming MD   Signed by:   Paulo Fruit  BS,CPht II,MPH on 09/11/2009   Method used:   Samples Given   RxID:   [id number] NORVIR 100 MG TABS (RITONAVIR) Take 1 tablet by mouth once a day  #30 x 0   Entered by:   Paulo Fruit  BS,CPht II,MPH   Authorized by:   Yisroel Ramming MD   Signed by:   Paulo Fruit  BS,CPht II,MPH on 09/11/2009   Method used:   Samples Given   RxID:   [id number]   Patient Assist Medication Verification: Medication: Prezista 400mg  Lot# [id number] Exp Date:12 2012 Tech approval:MLD                Patient Assist Medication Verification: Medication:Norvir 100mg  Lot# [id number] Exp Date:24 Apr 2011 Tech approval:MLD                Patient Assist Medication Verification: Medication:truvada Lot# [id number] Exp Date:11 2014 Tech approval:MLD Call placed to patient with message that assistance medications are ready for pick-up. Left message on pt's voicemail Paulo Fruit  BS,CPht II,MPH  Sep 11, 2009 11:43 AM

## 2010-05-20 NOTE — Progress Notes (Signed)
Summary: ncadap meds arrived for Aug  Phone Note Refill Request      Prescriptions: TRUVADA 200-300 MG TABS (EMTRICITABINE-TENOFOVIR) Take 1 tablet by mouth once a day  #30 x 0   Entered by:   Paulo Fruit  BS,CPht II,MPH   Authorized by:   Yisroel Ramming MD   Signed by:   Paulo Fruit  BS,CPht II,MPH on 12/17/2009   Method used:   Samples Given   RxID:   [id number] PREZISTA 400 MG TABS (DARUNAVIR ETHANOLATE) Take 2 tablest by mouth once a day  #60 x 0   Entered by:   Paulo Fruit  BS,CPht II,MPH   Authorized by:   Yisroel Ramming MD   Signed by:   Paulo Fruit  BS,CPht II,MPH on 12/17/2009   Method used:   Samples Given   RxID:   [id number] NORVIR 100 MG TABS (RITONAVIR) Take 1 tablet by mouth once a day  #30 x 0   Entered by:   Paulo Fruit  BS,CPht II,MPH   Authorized by:   Yisroel Ramming MD   Signed by:   Paulo Fruit  BS,CPht II,MPH on 12/17/2009   Method used:   Samples Given   RxID:   [id number]  Patient Assist Medication Verification: Medication name: Norvir 100mg  RX # [id number] Tech approval:MLD  Patient Assist Medication Verification: Medication name:Truvada RX # [id number] Tech approval:MLD  Patient Assist Medication Verification: Medication name:Prezista 400mg  RX # [id number] Tech approval:MLD Call placed to patient with message that assistance medications are ready for pick-up. Paulo Fruit  BS,CPht II,MPH  December 17, 2009 11:55 AM

## 2010-05-20 NOTE — Progress Notes (Signed)
Summary: [facility]/pt assist meds arrived for Jul  Phone Note Refill Request      Prescriptions: TRUVADA 200-300 MG TABS (EMTRICITABINE-TENOFOVIR) Take 1 tablet by mouth once a day  #30 x 0   Entered by:   Paulo Fruit  BS,CPht II,MPH   Authorized by:   Yisroel Ramming MD   Signed by:   Paulo Fruit  BS,CPht II,MPH on 10/22/2009   Method used:   Samples Given   RxID:   [id number] PREZISTA 400 MG TABS (DARUNAVIR ETHANOLATE) Take 2 tablest by mouth once a day  #60 x 0   Entered by:   Paulo Fruit  BS,CPht II,MPH   Authorized by:   Yisroel Ramming MD   Signed by:   Paulo Fruit  BS,CPht II,MPH on 10/22/2009   Method used:   Samples Given   RxID:   [id number] NORVIR 100 MG TABS (RITONAVIR) Take 1 tablet by mouth once a day  #30 x 0   Entered by:   Paulo Fruit  BS,CPht II,MPH   Authorized by:   Yisroel Ramming MD   Signed by:   Paulo Fruit  BS,CPht II,MPH on 10/22/2009   Method used:   Samples Given   RxID:   [id number]  Patient Assist Medication Verification: Medication name: Norvir 100mg  RX #  [id number] Tech approval:MLD  Patient Assist Medication Verification: Medication name:Truvada RX # [id number] Tech approval:MLD  Patient Assist Medication Verification: Medication name:Prezista 400mg  RX # [id number] Tech approval:MLD Call placed to patient with message that assistance medications are ready for pick-up. Paulo Fruit  BS,CPht II,MPH  October 22, 2009 2:22 PM

## 2010-05-20 NOTE — Progress Notes (Signed)
Summary: [facility] meds arrived for Jul  Phone Note Refill Request      Prescriptions: NORVIR 100 MG TABS (RITONAVIR) Take 1 tablet by mouth once a day  #30 x 0   Entered by:   Paulo Fruit  BS,CPht II,MPH   Authorized by:   Yisroel Ramming MD   Signed by:   Paulo Fruit  BS,CPht II,MPH on 11/15/2009   Method used:   Samples Given   RxID:   [id number] PREZISTA 400 MG TABS (DARUNAVIR ETHANOLATE) Take 2 tablest by mouth once a day  #60 x 0   Entered by:   Paulo Fruit  BS,CPht II,MPH   Authorized by:   Yisroel Ramming MD   Signed by:   Paulo Fruit  BS,CPht II,MPH on 11/15/2009   Method used:   Samples Given   RxID:   [id number] TRUVADA 200-300 MG TABS (EMTRICITABINE-TENOFOVIR) Take 1 tablet by mouth once a day  #30 x 0   Entered by:   Paulo Fruit  BS,CPht II,MPH   Authorized by:   Yisroel Ramming MD   Signed by:   Paulo Fruit  BS,CPht II,MPH on 11/15/2009   Method used:   Samples Given   RxID:   [id number]  Patient Assist Medication Verification: Medication name:Prezista 400mg  RX # [id number] Tech approval:MLD  Patient Assist Medication Verification: Medication name:Truvad RX # [id number] Tech approval:MLD  Patient Assist Medication Verification: Medication name:Norvir 100mg  RX # [id number] Tech approval:MLD  Call placed to patient with message that assistance medications are ready for pick-up. Left message for patient to contact Evalee Mutton Dupree  BS,CPht II,MPH  November 15, 2009 3:21 PM

## 2010-05-20 NOTE — Miscellaneous (Signed)
Summary: clinical update/ryan white  Clinical Lists Changes  Observations: Added new observation of PCTFPL: 117.16  (08/15/2009 14:19) Added new observation of INCOMESOURCE: Unemployment  (08/15/2009 14:19) Added new observation of HOUSEINCOME: 16109  (08/15/2009 14:19) Added new observation of FINASSESSDT: 08/15/2009  (08/15/2009 14:19) Added new observation of CASE MGM: Margrett Rud  (08/15/2009 14:19)

## 2010-05-20 NOTE — Progress Notes (Signed)
Summary: [facility]/pt assist meds arrived for Mar  Phone Note Refill Request      Prescriptions: TRUVADA 200-300 MG TABS (EMTRICITABINE-TENOFOVIR) Take 1 tablet by mouth once a day  #30 x 0   Entered by:   Paulo Fruit  BS,CPht II,MPH   Authorized by:   Yisroel Ramming MD   Signed by:   Paulo Fruit  BS,CPht II,MPH on 07/01/2009   Method used:   Samples Given   RxID:   [id number] PREZISTA 400 MG TABS (DARUNAVIR ETHANOLATE) Take 2 tablest by mouth once a day  #60 x 0   Entered by:   Paulo Fruit  BS,CPht II,MPH   Authorized by:   Yisroel Ramming MD   Signed by:   Paulo Fruit  BS,CPht II,MPH on 07/01/2009   Method used:   Samples Given   RxID:   [id number] NORVIR 100 MG TABS (RITONAVIR) Take 1 tablet by mouth once a day  #30 x 0   Entered by:   Paulo Fruit  BS,CPht II,MPH   Authorized by:   Yisroel Ramming MD   Signed by:   Paulo Fruit  BS,CPht II,MPH on 07/01/2009   Method used:   Samples Given   RxID:   [id number]   Patient Assist Medication Verification: Medication: Norvir 100mg  WNU#[id number] Exp Date:05 Mayu 2012 Tech approval:MLD                Patient Assist Medication Verification: Medication: Truvada Lot# [id number] Exp Date:09 2014 Tech approval:MLD                Patient Assist Medication Verification: Medication:Prezista 400mg  Lot#[id number] Exp Date:12 2012 Tech approval:MLD Call placed to patient with message that assistance medications are ready for pick-up. Paulo Fruit  BS,CPht II,MPH  July 01, 2009 3:47 PM

## 2010-05-20 NOTE — Progress Notes (Signed)
Summary: [facility]/pt assist meds arrived for apr  Phone Note Refill Request      Prescriptions: TRUVADA 200-300 MG TABS (EMTRICITABINE-TENOFOVIR) Take 1 tablet by mouth once a day  #30 x 0   Entered by:   Paulo Fruit  BS,CPht II,MPH   Authorized by:   Yisroel Ramming MD   Signed by:   Paulo Fruit  BS,CPht II,MPH on 08/16/2009   Method used:   Samples Given   RxID:   [id number] PREZISTA 400 MG TABS (DARUNAVIR ETHANOLATE) Take 2 tablest by mouth once a day  #60 x 0   Entered by:   Paulo Fruit  BS,CPht II,MPH   Authorized by:   Yisroel Ramming MD   Signed by:   Paulo Fruit  BS,CPht II,MPH on 08/16/2009   Method used:   Samples Given   RxID:   [id number] NORVIR 100 MG TABS (RITONAVIR) Take 1 tablet by mouth once a day  #30 x 0   Entered by:   Paulo Fruit  BS,CPht II,MPH   Authorized by:   Yisroel Ramming MD   Signed by:   Paulo Fruit  BS,CPht II,MPH on 08/16/2009   Method used:   Samples Given   RxID:   [id number]   Patient Assist Medication Verification: Medication: Norvir 100mg  Lot# [id number] Exp Date:20 Dec 2010 Tech approval:MLD                Patient Assist Medication Verification: Medication:Truvada Lot# [id number] Exp Date:09 2014 Tech approval:                Patient Assist Medication Verification: Medication:Prezista 400mg  Lot# [id number] Exp Date:02 2013 Tech approval:MLD **Patient called and is on the way to pick up April's medication via [facility]** Paulo Fruit  BS,CPht II,MPH  August 16, 2009 4:13 PM

## 2010-05-20 NOTE — Progress Notes (Signed)
Summary: ADAP meds arrived, pt. notified  Phone Note Outgoing Call   Call placed by: Annice Pih Summary of Call: ADAP meds arrived, pt. notified Initial call taken by: [name] CMA [facility]),  March 07, 2010 3:58 PM     Appended Document: ADAP meds arrived, pt. notified pt. picked up ADAP meds

## 2010-05-20 NOTE — Assessment & Plan Note (Signed)
Summary: F/U/VS   CC:  3 month follow up.  History of Present Illness: Pt here for f/u.  He lost his job and is curently looking for another.  He was called about 2 months ago and told he may have been exposed to chlamydia.  He has no symptoms.  Preventive Screening-Counseling & Management  Alcohol-Tobacco     Alcohol drinks/day: seldom     Alcohol type: all     Smoking Status: current     Smoking Cessation Counseling: yes     Smoke Cessation Stage: contemplative     Packs/Day: 0.5     Year Started: 10     Passive Smoke Exposure: no  Caffeine-Diet-Exercise     Caffeine use/day: occassionally     Does Patient Exercise: yes     Type of exercise: walk     Exercise (avg: min/session): >60     Times/week: 5  Safety-Violence-Falls     Seat Belt Use: yes   Updated Prior Medication List: PROMETHAZINE HCL 25 MG TABS (PROMETHAZINE HCL) 1 every 4-6 hours as needed nausea NORVIR 100 MG TABS (RITONAVIR) Take 1 tablet by mouth once a day PREZISTA 400 MG TABS (DARUNAVIR ETHANOLATE) Take 2 tablest by mouth once a day TRUVADA 200-300 MG TABS (EMTRICITABINE-TENOFOVIR) Take 1 tablet by mouth once a day ENSURE  LIQD (NUTRITIONAL SUPPLEMENTS) one can two times a day  Current Allergies (reviewed today): No known allergies  Past History:  Past Medical History: Last updated: 05/11/2006 HIV disease Recurrent skin infection Adenopathy  Review of Systems  The patient denies anorexia, fever, and weight loss.    Vital Signs:  Patient profile:   30 year old male Height:      72 inches (182.88 cm) Weight:      173.4 pounds (78.82 kg) BMI:     23.60 Temp:     97.0 degrees F (36.11 degrees C) oral Pulse rate:   74 / minute BP sitting:   117 / 66  (right arm)  Vitals Entered By: Baxter Hire) (May 17, 2009 10:54 AM) CC: 3 month follow up Is Patient Diabetic? No Pain Assessment Patient in pain? yes     Location: left shoulder/left index finger Intensity: 3 Type:  soreness Onset of pain  With activity Nutritional Status BMI of 19 -24 = normal Nutritional Status Detail appetite is good per patient   Have you ever been in a relationship where you felt threatened, hurt or afraid?No   Does patient need assistance? Functional Status Self care Ambulation Normal   Physical Exam  General:  alert, well-developed, well-nourished, and well-hydrated.   Head:  normocephalic and atraumatic.   Mouth:  pharynx pink and moist.      Impression & Recommendations:  Problem # 1:  HIV DISEASE (ICD-042) Pt.s most recent CD4ct was 670 and VL 53 .  Pt instructed to continue the current antiretroviral regimen.  Pt encouraged to take medication regularly and not miss doses.  Pt will f/u in 3 months for repeat blood work and will see me 2 weeks later.  Orders: Est. Patient Level III (04540) T-GC Probe, urine [phone]) T-Chlamydia  Probe, urine [phone]) T-RPR (Syphilis) [phone])  Diagnostics Reviewed:  HIV: HIV positive - not AIDS (10/19/2008)   CD4: 670 (05/03/2009)   CD4 %: 23 (06/16/2006) WBC: 7.1 (05/03/2009)   Hgb: 14.3 (05/03/2009)   HCT: 43.2 (05/03/2009)   Platelets: 270 (05/03/2009) HIV genotype: See Comment (05/23/2008)   HIV-1 RNA: 53 (05/03/2009)   HBSAg: NO (  06/14/2006)  Problem # 2:  CONTACT WITH OR EXPOSURE TO VENEREAL DISEASES (ICD-V01.6) will check for GC, chlamydia and syphilis  Other Orders: Future Orders: T-CD4SP ([facility]) (CD4SP) ... 08/15/2009 T-HIV Viral Load [phone]) ... 08/15/2009 T-Comprehensive Metabolic Panel [phone]) ... 08/15/2009 T-CBC w/Diff ([id number]) ... 08/15/2009  Patient Instructions: 1)  Please schedule a follow-up appointment in 3 months, 2 weeks after labs.  Process Orders Check Orders Results:     [facility]: ABN not required for this insurance Tests Sent for requisitioning (May 17, 2009 3:43 PM):     05/17/2009: [facility] -- [employer], urine  [phone] (signed)     05/17/2009: [facility] -- T-Chlamydia  Probe, urine [phone] (signed)     05/17/2009: [facility] -- T-RPR (Syphilis) [phone] (signed)     08/15/2009: [facility] -- T-HIV Viral Load [phone] (signed)     08/15/2009: [facility] -- T-Comprehensive Metabolic Panel [[id number]] (signed)     08/15/2009: [facility] -- [facility] w/Diff [[id number]] (signed)

## 2010-05-20 NOTE — Progress Notes (Signed)
Summary: pt. notified ADAP meds arrived  Phone Note Outgoing Call   Call placed by: Annice Pih Summary of Call: Pt. notified ADAP meds arrived for October, Prezista, Truvada, and Norvir Initial call taken by: [name] CMA Duncan Dull),  February 10, 2010 11:42 AM

## 2010-05-20 NOTE — Progress Notes (Signed)
Summary: ncadap meds arrived for sept-Unable to reach pt. vmbox full  Phone Note Refill Request      Prescriptions: TRUVADA 200-300 MG TABS (EMTRICITABINE-TENOFOVIR) Take 1 tablet by mouth once a day  #30 x 0   Entered by:   Paulo Fruit  BS,CPht II,MPH   Authorized by:   Yisroel Ramming MD   Signed by:   Paulo Fruit  BS,CPht II,MPH on 01/10/2010   Method used:   Samples Given   RxID:   [id number] PREZISTA 400 MG TABS (DARUNAVIR ETHANOLATE) Take 2 tablest by mouth once a day  #60 x 0   Entered by:   Paulo Fruit  BS,CPht II,MPH   Authorized by:   Yisroel Ramming MD   Signed by:   Paulo Fruit  BS,CPht II,MPH on 01/10/2010   Method used:   Samples Given   RxID:   [id number] NORVIR 100 MG TABS (RITONAVIR) Take 1 tablet by mouth once a day  #30 x 0   Entered by:   Paulo Fruit  BS,CPht II,MPH   Authorized by:   Yisroel Ramming MD   Signed by:   Paulo Fruit  BS,CPht II,MPH on 01/10/2010   Method used:   Samples Given   RxID:   [id number]  Patient Assist Medication Verification: Medication name: norvir 100mg  RX # [id number] Tech approval:mld  Patient Assist Medication Verification: Medication name:truvada RX # [id number] Tech approval:mld  Patient Assist Medication Verification: Medication name:prezista 400mg  RX # [id number] Tech approval:mld Tried to call patient.  Unable to leave a message because at the time call was placed his voicemailbox was full and unable to accept messages. Paulo Fruit  BS,CPht II,MPH  January 10, 2010 12:39 PM

## 2010-05-20 NOTE — Progress Notes (Signed)
Summary: [place]/pt assist meds arrived  for Feb  Phone Note Refill Request      Prescriptions: TRUVADA 200-300 MG TABS (EMTRICITABINE-TENOFOVIR) Take 1 tablet by mouth once a day  #30 x 0   Entered by:   Paulo Fruit  BS,CPht II,MPH   Authorized by:   Yisroel Ramming MD   Signed by:   Paulo Fruit  BS,CPht II,MPH on 06/01/2009   Method used:   Samples Given   RxID:   [id number] PREZISTA 400 MG TABS (DARUNAVIR ETHANOLATE) Take 2 tablest by mouth once a day  #60 x 0   Entered by:   Paulo Fruit  BS,CPht II,MPH   Authorized by:   Yisroel Ramming MD   Signed by:   Paulo Fruit  BS,CPht II,MPH on 06/01/2009   Method used:   Samples Given   RxID:   [id number] NORVIR 100 MG TABS (RITONAVIR) Take 1 tablet by mouth once a day  #30 x 0   Entered by:   Paulo Fruit  BS,CPht II,MPH   Authorized by:   Yisroel Ramming MD   Signed by:   Paulo Fruit  BS,CPht II,MPH on 06/01/2009   Method used:   Samples Given   RxID:   [id number]   Patient Assist Medication Verification: Medication: Prezista 400mg  Lot#[id number] Exp Date:12 2012 Tech approval:MLD                Patient Assist Medication Verification: Medication:Norvir 100mg  WER#[id number] Exp Date:21 Aug 2010 Tech approval:MLD                Patient Assist Medication Verification: Medication:Truvada Lot# [id number] Exp Date:07 2014 Tech approval:MLD Will call pt on Monday, 06/03/09 Paulo Fruit  BS,CPht II,MPH  June 01, 2009 10:25 AM

## 2010-06-12 ENCOUNTER — Telehealth (INDEPENDENT_AMBULATORY_CARE_PROVIDER_SITE_OTHER): Payer: Self-pay | Admitting: *Deleted

## 2010-06-17 NOTE — Progress Notes (Signed)
Summary: Pt. needs to reapply for ADAP and needs MD/lab appt.  Phone Note Call from Patient Call back at [facility] Phone [phone]   Caller: Patient Reason for Call: Talk to Nurse Summary of Call: Pt. needing refills ASAP.  RN faxed refills to [facility] at [facility]  Pt. has not seen a MD since Sept. 2011.  RN informed pt. that he needs to call the Center to make lab and MD appt.  RN also reminded pt. the he needs to reapply for [place] ADAP now.  Pt. given tel # for [employer] Counselor to make appt. to reapply.  Jennet Maduro RN  June 12, 2010 10:57 AM

## 2010-07-04 LAB — T-HELPER CELL (CD4) - (RCID CLINIC ONLY): CD4 % Helper T Cell: 38 % (ref 33–55)

## 2010-07-06 LAB — T-HELPER CELL (CD4) - (RCID CLINIC ONLY): CD4 T Cell Abs: 670 uL (ref 400–2700)

## 2010-07-08 LAB — T-HELPER CELL (CD4) - (RCID CLINIC ONLY): CD4 % Helper T Cell: 34 % (ref 33–55)

## 2010-07-28 ENCOUNTER — Other Ambulatory Visit: Payer: Self-pay

## 2010-07-28 LAB — T-HELPER CELL (CD4) - (RCID CLINIC ONLY): CD4 % Helper T Cell: 31 % — ABNORMAL LOW (ref 33–55)

## 2010-07-30 LAB — T-HELPER CELL (CD4) - (RCID CLINIC ONLY): CD4 % Helper T Cell: 35 % (ref 33–55)

## 2010-08-04 LAB — T-HELPER CELL (CD4) - (RCID CLINIC ONLY)
CD4 % Helper T Cell: 37 % (ref 33–55)
CD4 T Cell Abs: 940 uL (ref 400–2700)

## 2010-08-11 ENCOUNTER — Encounter: Payer: Self-pay | Admitting: Adult Health

## 2010-09-04 ENCOUNTER — Ambulatory Visit: Payer: Self-pay | Admitting: Adult Health

## 2010-09-08 ENCOUNTER — Encounter: Payer: Self-pay | Admitting: Adult Health

## 2010-09-08 ENCOUNTER — Ambulatory Visit (INDEPENDENT_AMBULATORY_CARE_PROVIDER_SITE_OTHER): Payer: Self-pay | Admitting: Adult Health

## 2010-09-08 DIAGNOSIS — Z79899 Other long term (current) drug therapy: Secondary | ICD-10-CM

## 2010-09-08 DIAGNOSIS — B20 Human immunodeficiency virus [HIV] disease: Secondary | ICD-10-CM

## 2010-09-08 DIAGNOSIS — Z113 Encounter for screening for infections with a predominantly sexual mode of transmission: Secondary | ICD-10-CM

## 2010-09-08 LAB — RPR

## 2010-09-08 LAB — CBC WITH DIFFERENTIAL/PLATELET
Basophils Relative: 1 % (ref 0–1)
Eosinophils Absolute: 0.5 10*3/uL (ref 0.0–0.7)
Lymphs Abs: 1.8 10*3/uL (ref 0.7–4.0)
MCH: 34.1 pg — ABNORMAL HIGH (ref 26.0–34.0)
Neutrophils Relative %: 50 % (ref 43–77)
Platelets: 306 10*3/uL (ref 150–400)
RBC: 3.99 MIL/uL — ABNORMAL LOW (ref 4.22–5.81)

## 2010-09-08 LAB — LIPID PANEL
Cholesterol: 149 mg/dL (ref 0–200)
Triglycerides: 96 mg/dL (ref <150)

## 2010-09-08 NOTE — Progress Notes (Signed)
  Subjective:    Patient ID: Jeff Ross, male    DOB: 17-Jun-1980, 30 y.o.   MRN: [id number]  HPI Presents with a 2+ week history of sore irritated throat and coughing up phlegm. Denies fever, shortness of breath, swollen lymph nodes or dyspnea on exertion. Denies dysphagia or odynophagia. States his niece was diagnosed with strep throat "some time ago" and was concerned that he may have it. Has not had labs since August of 2011 and, states he's generally been well and in good health.   Review of Systems  Constitutional: Negative for fever, chills and fatigue.  HENT: Positive for congestion, rhinorrhea and postnasal drip. Negative for hearing loss, ear pain, neck pain and ear discharge.   Eyes: Negative.   Respiratory: Positive for cough. Negative for shortness of breath.   Cardiovascular: Negative.   Gastrointestinal: Negative.   Genitourinary: Negative.   Musculoskeletal: Negative.   Skin: Negative.   Neurological: Negative.   Hematological: Negative.   Psychiatric/Behavioral: Negative.        Objective:   Physical Exam  Constitutional: He is oriented to person, place, and time. He appears well-developed and well-nourished. No distress.  HENT:  Head: Normocephalic and atraumatic.  Right Ear: External ear normal.  Left Ear: External ear normal.  Mouth/Throat: Oropharynx is clear and moist.       Copious amounts of postpharyngeal drainage. No exudates noted. No erythema noted.  Eyes: Conjunctivae and EOM are normal. Pupils are equal, round, and reactive to light.  Neck: Normal range of motion. Neck supple.  Cardiovascular: Normal rate, regular rhythm, normal heart sounds and intact distal pulses.   Pulmonary/Chest: Effort normal and breath sounds normal.  Abdominal: Soft. Bowel sounds are normal.  Musculoskeletal: Normal range of motion.  Lymphadenopathy:    He has no cervical adenopathy.  Neurological: He is alert and oriented to person, place, and time. No cranial nerve  deficit. He exhibits normal muscle tone. Coordination normal.  Skin: Skin is warm and dry.  Psychiatric: He has a normal mood and affect. His behavior is normal. Judgment and thought content normal.          Assessment & Plan:  1. Sore Throat with Cough. This is more consistent with a pharyngitis that is associated with allergic rhinitis. Recommend for now. Cetirizine, 10 mg by mouth daily, and salt water gargles when necessary. There is no indication at this point of an active pathology for strep throat. By history and clinical findings, alone, is appear allergic in nature. Should he develop a fever, or swollen lymph nodes, he is to contact the clinic for reevaluation.  2. HIV. Labs since August 2011. Will repeat staging labs, including lipids, urinalysis, and screening for STI's. Requested he return to clinic in 3 weeks for followup.  Verbally acknowledged all information provided for him and agreed with plan of care.

## 2010-09-09 LAB — COMPLETE METABOLIC PANEL WITH GFR
ALT: 11 U/L (ref 0–53)
Alkaline Phosphatase: 111 U/L (ref 39–117)
Sodium: 140 mEq/L (ref 135–145)
Total Bilirubin: 0.7 mg/dL (ref 0.3–1.2)
Total Protein: 7.3 g/dL (ref 6.0–8.3)

## 2010-09-09 LAB — URINALYSIS
Glucose, UA: NEGATIVE mg/dL
Leukocytes, UA: NEGATIVE
Protein, ur: 30 mg/dL — AB
Specific Gravity, Urine: 1.029 (ref 1.005–1.030)
Urobilinogen, UA: 1 mg/dL (ref 0.0–1.0)

## 2010-09-09 LAB — GC/CHLAMYDIA PROBE AMP, URINE
Chlamydia, Swab/Urine, PCR: NEGATIVE
GC Probe Amp, Urine: NEGATIVE

## 2010-09-09 LAB — T-HELPER CELL (CD4) - (RCID CLINIC ONLY)
CD4 % Helper T Cell: 38 % (ref 33–55)
CD4 T Cell Abs: 750 uL (ref 400–2700)

## 2010-09-11 ENCOUNTER — Telehealth: Payer: Self-pay | Admitting: *Deleted

## 2010-09-11 NOTE — Telephone Encounter (Signed)
Patient called for results of urine test and RPR.  Left patient message that his results were negative for infection. [name] CMA

## 2010-09-22 ENCOUNTER — Other Ambulatory Visit: Payer: Self-pay

## 2010-09-29 ENCOUNTER — Ambulatory Visit: Payer: Self-pay | Admitting: Adult Health

## 2010-09-30 ENCOUNTER — Ambulatory Visit: Payer: Self-pay | Admitting: Internal Medicine

## 2010-10-06 ENCOUNTER — Ambulatory Visit: Payer: Self-pay | Admitting: Adult Health

## 2010-12-11 ENCOUNTER — Ambulatory Visit: Payer: Self-pay

## 2011-01-01 ENCOUNTER — Ambulatory Visit: Payer: Self-pay

## 2011-01-08 ENCOUNTER — Other Ambulatory Visit: Payer: Self-pay | Admitting: Licensed Clinical Social Worker

## 2011-01-08 ENCOUNTER — Other Ambulatory Visit: Payer: Self-pay

## 2011-01-08 DIAGNOSIS — B20 Human immunodeficiency virus [HIV] disease: Secondary | ICD-10-CM

## 2011-01-19 LAB — T-HELPER CELL (CD4) - (RCID CLINIC ONLY): CD4 % Helper T Cell: 30 — ABNORMAL LOW

## 2011-01-22 ENCOUNTER — Ambulatory Visit: Payer: Self-pay | Admitting: Infectious Diseases

## 2011-01-29 LAB — T-HELPER CELL (CD4) - (RCID CLINIC ONLY): CD4 T Cell Abs: 420

## 2011-02-02 LAB — T-HELPER CELL (CD4) - (RCID CLINIC ONLY)
CD4 % Helper T Cell: 23 — ABNORMAL LOW
CD4 T Cell Abs: 410

## 2011-02-11 ENCOUNTER — Emergency Department (HOSPITAL_COMMUNITY)
Admission: EM | Admit: 2011-02-11 | Discharge: 2011-02-11 | Disposition: A | Discharge status: Home / Self Care | Payer: Self-pay | Attending: Emergency Medicine | Admitting: Emergency Medicine

## 2011-02-11 DIAGNOSIS — L989 Disorder of the skin and subcutaneous tissue, unspecified: Secondary | ICD-10-CM | POA: Insufficient documentation

## 2011-02-11 DIAGNOSIS — F17200 Nicotine dependence, unspecified, uncomplicated: Secondary | ICD-10-CM | POA: Insufficient documentation

## 2011-02-11 DIAGNOSIS — R21 Rash and other nonspecific skin eruption: Secondary | ICD-10-CM | POA: Insufficient documentation

## 2011-02-11 DIAGNOSIS — Z21 Asymptomatic human immunodeficiency virus [HIV] infection status: Secondary | ICD-10-CM | POA: Insufficient documentation

## 2011-04-16 ENCOUNTER — Telehealth: Payer: Self-pay | Admitting: *Deleted

## 2011-04-16 NOTE — Telephone Encounter (Signed)
Called patient to have him reschedule and appointment and had to leave a message.

## 2011-05-11 ENCOUNTER — Other Ambulatory Visit: Payer: Self-pay | Admitting: *Deleted

## 2011-05-11 DIAGNOSIS — B20 Human immunodeficiency virus [HIV] disease: Secondary | ICD-10-CM

## 2011-05-11 MED ORDER — DARUNAVIR ETHANOLATE 800 MG PO TABS: 800.0000 mg | ORAL_TABLET | Freq: Every day | ORAL | Status: DC

## 2011-05-14 ENCOUNTER — Other Ambulatory Visit: Payer: Self-pay | Admitting: Infectious Diseases

## 2011-05-19 ENCOUNTER — Telehealth: Payer: Self-pay | Admitting: *Deleted

## 2011-05-19 ENCOUNTER — Other Ambulatory Visit: Payer: Self-pay | Admitting: Adult Health

## 2011-05-19 DIAGNOSIS — B20 Human immunodeficiency virus [HIV] disease: Secondary | ICD-10-CM

## 2011-05-19 NOTE — Telephone Encounter (Signed)
Transferred pt to scheduler to make f/u lab and provider appts.

## 2011-05-25 NOTE — Progress Notes (Signed)
This encounter was created in error - please disregard.

## 2011-05-26 ENCOUNTER — Other Ambulatory Visit: Payer: Self-pay

## 2011-05-29 ENCOUNTER — Telehealth: Payer: Self-pay | Admitting: *Deleted

## 2011-05-29 NOTE — Telephone Encounter (Signed)
Called patient and left message to call to reschedule his lab appointment.  He no showed 05/26/11. [name] CMA

## 2011-06-08 ENCOUNTER — Ambulatory Visit: Payer: Self-pay | Admitting: Adult Health

## 2011-06-08 ENCOUNTER — Telehealth: Payer: Self-pay | Admitting: *Deleted

## 2011-06-08 NOTE — Telephone Encounter (Signed)
Called patient to remind of missed appt. Had to leave a message for the patient to call and reschedule.

## 2011-07-13 ENCOUNTER — Telehealth: Payer: Self-pay | Admitting: *Deleted

## 2011-07-13 DIAGNOSIS — B20 Human immunodeficiency virus [HIV] disease: Secondary | ICD-10-CM

## 2011-07-13 MED ORDER — DARUNAVIR ETHANOLATE 800 MG PO TABS: 800.0000 mg | ORAL_TABLET | Freq: Every day | ORAL | Status: DC

## 2011-07-13 MED ORDER — EMTRICITABINE-TENOFOVIR DF 200-300 MG PO TABS: 1.0000 | ORAL_TABLET | Freq: Every day | ORAL | Status: DC

## 2011-07-13 MED ORDER — RITONAVIR 100 MG PO TABS: 100.0000 mg | ORAL_TABLET | Freq: Every day | ORAL | Status: DC

## 2011-07-13 NOTE — Telephone Encounter (Signed)
Phone call to pt.  He has not had an appt at [facility] since May 2012.  Has No Showed and canceled multiple appts.  Needs to apply for ADAP for medications.  Pt unable to talk a phone.  Stated that he would call back and make appts.  Pt given phone number for ADAP Coordinator.  Pt hung up.  RN will refer to [facility] Counselor to follow-up with the pt.

## 2011-07-14 ENCOUNTER — Telehealth: Payer: Self-pay

## 2011-07-14 NOTE — Telephone Encounter (Signed)
Several attempts made by Amy Pulliam of [facility] to contact patient for adap/rw renewal - patient not shown up or gotten back.

## 2011-08-27 ENCOUNTER — Ambulatory Visit: Payer: Self-pay

## 2011-08-27 ENCOUNTER — Other Ambulatory Visit: Payer: Self-pay

## 2011-08-31 ENCOUNTER — Other Ambulatory Visit: Payer: Self-pay

## 2011-08-31 ENCOUNTER — Ambulatory Visit: Payer: Self-pay

## 2011-08-31 ENCOUNTER — Telehealth: Payer: Self-pay

## 2011-08-31 DIAGNOSIS — B20 Human immunodeficiency virus [HIV] disease: Secondary | ICD-10-CM

## 2011-08-31 NOTE — Telephone Encounter (Addendum)
FYI - Patient has No Showed several times with me for RW and ADAP - Amy P. At [facility] tried to work with him as well and made several attempts.

## 2011-09-01 ENCOUNTER — Ambulatory Visit: Payer: Self-pay

## 2011-09-01 LAB — CBC WITH DIFFERENTIAL/PLATELET
Basophils Absolute: 0 10*3/uL (ref 0.0–0.1)
Basophils Relative: 1 % (ref 0–1)
MCHC: 31.5 g/dL (ref 30.0–36.0)
Neutro Abs: 3.1 10*3/uL (ref 1.7–7.7)
Neutrophils Relative %: 51 % (ref 43–77)
RDW: 12.7 % (ref 11.5–15.5)

## 2011-09-01 LAB — COMPREHENSIVE METABOLIC PANEL
AST: 16 U/L (ref 0–37)
Albumin: 4.3 g/dL (ref 3.5–5.2)
Alkaline Phosphatase: 121 U/L — ABNORMAL HIGH (ref 39–117)
Potassium: 4.3 mEq/L (ref 3.5–5.3)
Sodium: 138 mEq/L (ref 135–145)
Total Protein: 7.5 g/dL (ref 6.0–8.3)

## 2011-09-01 LAB — T-HELPER CELL (CD4) - (RCID CLINIC ONLY): CD4 T Cell Abs: 710 uL (ref 400–2700)

## 2011-09-02 ENCOUNTER — Other Ambulatory Visit: Payer: Self-pay | Admitting: *Deleted

## 2011-09-02 DIAGNOSIS — B20 Human immunodeficiency virus [HIV] disease: Secondary | ICD-10-CM

## 2011-09-02 MED ORDER — RITONAVIR 100 MG PO TABS: 100.0000 mg | ORAL_TABLET | Freq: Every day | ORAL | Status: DC

## 2011-09-02 MED ORDER — DARUNAVIR ETHANOLATE 800 MG PO TABS: 800.0000 mg | ORAL_TABLET | Freq: Every day | ORAL | Status: DC

## 2011-09-02 MED ORDER — EMTRICITABINE-TENOFOVIR DF 200-300 MG PO TABS: 1.0000 | ORAL_TABLET | Freq: Every day | ORAL | Status: DC

## 2011-09-15 ENCOUNTER — Ambulatory Visit (INDEPENDENT_AMBULATORY_CARE_PROVIDER_SITE_OTHER): Payer: Self-pay | Admitting: Internal Medicine

## 2011-09-15 ENCOUNTER — Encounter: Payer: Self-pay | Admitting: Internal Medicine

## 2011-09-15 VITALS — BP 144/66 | HR 81 | Temp 98.1°F | Ht 71.0 in | Wt 186.0 lb

## 2011-09-15 DIAGNOSIS — B20 Human immunodeficiency virus [HIV] disease: Secondary | ICD-10-CM

## 2011-09-15 DIAGNOSIS — Z23 Encounter for immunization: Secondary | ICD-10-CM

## 2011-09-16 NOTE — Progress Notes (Signed)
  Subjective:    Patient ID: Jeff Ross, male    DOB: [birth year], 31 y.o.   MRN: [id number]  HPI Here for follow up of HIV.  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  Despite that, he has been taking his meds and his viral load recently remained undetectable.  Unfortunately, he ran out of meds and now is out.  No other new issues.  He is interested in hormonal therapy. He has had recent stressors in his life but is not interested in counselling or seeing a mental health provider.     Review of Systems  Constitutional: Negative for fever, chills, fatigue and unexpected weight change.  HENT: Negative for sore throat and trouble swallowing.   Respiratory: Negative for cough, shortness of breath and wheezing.   Cardiovascular: Negative for chest pain, palpitations and leg swelling.  Gastrointestinal: Negative for nausea, abdominal pain and diarrhea.  Musculoskeletal: Negative for myalgias, joint swelling and arthralgias.  Skin: Negative for pallor and rash.  Neurological: Negative.   Hematological: Negative for adenopathy.  Psychiatric/Behavioral: Negative for dysphoric mood. The patient is not nervous/anxious.        Objective:   Physical Exam  Constitutional: He appears well-developed and well-nourished. No distress.  HENT:  Mouth/Throat: Oropharynx is clear and moist. No oropharyngeal exudate.  Cardiovascular: Normal rate, regular rhythm and normal heart sounds.  Exam reveals no gallop and no friction rub.   No murmur heard. Pulmonary/Chest: Effort normal and breath sounds normal. No respiratory distress. He has no wheezes. He has no rales.  Abdominal: Soft. Bowel sounds are normal. He exhibits no distension. There is no tenderness. There is no rebound.  Lymphadenopathy:    He has no cervical adenopathy.  Skin: Skin is warm and dry. No rash noted. No erythema.  Psychiatric: He has a normal mood and affect. His behavior is normal.           Assessment & Plan:

## 2011-09-16 NOTE — Assessment & Plan Note (Addendum)
Doing well but now out.  Will recheck in a few weeks to assure no resistance.  I reminded him not to stop to avoid resistance and discussed mechanisms of resistance.  I reminded him to use condoms with all sexual activity.    Per pt request, I will investigate hormonal therapy in regards to cost and ADAP and when he follows up next visit will discuss.

## 2011-09-17 ENCOUNTER — Ambulatory Visit: Payer: Self-pay

## 2011-09-17 ENCOUNTER — Ambulatory Visit: Payer: Self-pay | Admitting: Internal Medicine

## 2011-09-17 ENCOUNTER — Other Ambulatory Visit: Payer: Self-pay | Admitting: *Deleted

## 2011-09-17 DIAGNOSIS — B20 Human immunodeficiency virus [HIV] disease: Secondary | ICD-10-CM

## 2011-09-17 MED ORDER — EMTRICITABINE-TENOFOVIR DF 200-300 MG PO TABS: 1.0000 | ORAL_TABLET | Freq: Every day | ORAL | Status: DC

## 2011-09-17 MED ORDER — DARUNAVIR ETHANOLATE 800 MG PO TABS: 800.0000 mg | ORAL_TABLET | Freq: Every day | ORAL | Status: DC

## 2011-09-17 MED ORDER — RITONAVIR 100 MG PO TABS: 100.0000 mg | ORAL_TABLET | Freq: Every day | ORAL | Status: DC

## 2011-09-17 NOTE — Telephone Encounter (Signed)
Patient ADAP was approved. 

## 2011-10-13 ENCOUNTER — Other Ambulatory Visit (INDEPENDENT_AMBULATORY_CARE_PROVIDER_SITE_OTHER): Payer: Self-pay

## 2011-10-13 ENCOUNTER — Telehealth: Payer: Self-pay | Admitting: *Deleted

## 2011-10-13 DIAGNOSIS — B20 Human immunodeficiency virus [HIV] disease: Secondary | ICD-10-CM

## 2011-10-14 LAB — T-HELPER CELL (CD4) - (RCID CLINIC ONLY): CD4 T Cell Abs: 1000 uL (ref 400–2700)

## 2011-10-14 NOTE — Telephone Encounter (Signed)
RN reviewed pt's ADAP folder.  ADAP eligibility starts 10/19/11.  [facility] notified of date.

## 2011-10-15 ENCOUNTER — Telehealth: Payer: Self-pay | Admitting: *Deleted

## 2011-10-15 ENCOUNTER — Other Ambulatory Visit: Payer: Self-pay | Admitting: Internal Medicine

## 2011-10-15 DIAGNOSIS — B20 Human immunodeficiency virus [HIV] disease: Secondary | ICD-10-CM

## 2011-10-15 LAB — HIV-1 RNA ULTRAQUANT REFLEX TO GENTYP+: HIV-1 RNA Quant, Log: 4.93 {Log} — ABNORMAL HIGH (ref <1.30)

## 2011-10-15 NOTE — Telephone Encounter (Signed)
Message copied by Macy Mis on Thu Oct 15, 2011  2:57 PM ------      Message from: [name]      Created: Thu Oct 15, 2011  2:15 PM       Patient just got adap, to start 7/1.  He just got labs but has not been on meds.  Have him start the meds and get repeat labs in about 3-4 weeks after starting and change his appt with me to 2 weeks after the labs.  thanks

## 2011-10-15 NOTE — Telephone Encounter (Signed)
Called patient, left message to return call.  He needs repeat labs 3 weeks after starting meds and an appt with Dr. Luciana Axe 2 weeks after labs. [name]

## 2011-10-23 LAB — HIV-1 GENOTYPR PLUS

## 2011-10-27 ENCOUNTER — Ambulatory Visit: Payer: Self-pay | Admitting: Internal Medicine

## 2012-02-22 ENCOUNTER — Ambulatory Visit: Payer: Self-pay

## 2012-02-22 ENCOUNTER — Other Ambulatory Visit (INDEPENDENT_AMBULATORY_CARE_PROVIDER_SITE_OTHER): Payer: Self-pay

## 2012-02-22 DIAGNOSIS — B20 Human immunodeficiency virus [HIV] disease: Secondary | ICD-10-CM

## 2012-02-22 DIAGNOSIS — Z79899 Other long term (current) drug therapy: Secondary | ICD-10-CM

## 2012-02-22 DIAGNOSIS — Z113 Encounter for screening for infections with a predominantly sexual mode of transmission: Secondary | ICD-10-CM

## 2012-02-22 LAB — CBC WITH DIFFERENTIAL/PLATELET
Basophils Absolute: 0 10*3/uL (ref 0.0–0.1)
Eosinophils Relative: 8 % — ABNORMAL HIGH (ref 0–5)
HCT: 42.4 % (ref 39.0–52.0)
Lymphocytes Relative: 29 % (ref 12–46)
Lymphs Abs: 2.6 10*3/uL (ref 0.7–4.0)
MCV: 102.7 fL — ABNORMAL HIGH (ref 78.0–100.0)
Monocytes Absolute: 0.6 10*3/uL (ref 0.1–1.0)
Monocytes Relative: 7 % (ref 3–12)
RDW: 12.4 % (ref 11.5–15.5)
WBC: 9 10*3/uL (ref 4.0–10.5)

## 2012-02-23 LAB — COMPLETE METABOLIC PANEL WITH GFR
BUN: 12 mg/dL (ref 6–23)
CO2: 25 mEq/L (ref 19–32)
Calcium: 9.4 mg/dL (ref 8.4–10.5)
Chloride: 105 mEq/L (ref 96–112)
Creat: 0.95 mg/dL (ref 0.50–1.35)
GFR, Est African American: >89 mL/min
GFR, Est Non African American: >89 mL/min
Glucose, Bld: 92 mg/dL (ref 70–99)

## 2012-02-23 LAB — T-HELPER CELL (CD4) - (RCID CLINIC ONLY)
CD4 % Helper T Cell: 33 % (ref 33–55)
CD4 T Cell Abs: 720 uL (ref 400–2700)

## 2012-02-23 LAB — RPR

## 2012-02-23 LAB — LIPID PANEL: Cholesterol: 158 mg/dL (ref 0–200)

## 2012-02-24 ENCOUNTER — Other Ambulatory Visit: Payer: Self-pay | Admitting: *Deleted

## 2012-02-24 DIAGNOSIS — B20 Human immunodeficiency virus [HIV] disease: Secondary | ICD-10-CM

## 2012-02-24 MED ORDER — RITONAVIR 100 MG PO TABS: 100.0000 mg | ORAL_TABLET | Freq: Every day | ORAL | Status: DC

## 2012-02-24 MED ORDER — EMTRICITABINE-TENOFOVIR DF 200-300 MG PO TABS: 1.0000 | ORAL_TABLET | Freq: Every day | ORAL | Status: DC

## 2012-02-24 MED ORDER — DARUNAVIR ETHANOLATE 800 MG PO TABS: 800.0000 mg | ORAL_TABLET | Freq: Every day | ORAL | Status: DC

## 2012-02-24 NOTE — Telephone Encounter (Signed)
Rx printed to go with patient ADAP application 

## 2012-03-14 ENCOUNTER — Ambulatory Visit: Payer: Self-pay | Admitting: Internal Medicine

## 2012-03-14 ENCOUNTER — Encounter: Payer: Self-pay | Admitting: Internal Medicine

## 2012-03-14 ENCOUNTER — Ambulatory Visit (INDEPENDENT_AMBULATORY_CARE_PROVIDER_SITE_OTHER): Payer: Self-pay | Admitting: Internal Medicine

## 2012-03-14 VITALS — BP 128/72 | HR 89 | Temp 97.8°F | Ht 71.5 in | Wt 178.0 lb

## 2012-03-14 DIAGNOSIS — B20 Human immunodeficiency virus [HIV] disease: Secondary | ICD-10-CM

## 2012-03-14 DIAGNOSIS — Z23 Encounter for immunization: Secondary | ICD-10-CM

## 2012-03-14 DIAGNOSIS — Z789 Other specified health status: Secondary | ICD-10-CM | POA: Insufficient documentation

## 2012-03-14 DIAGNOSIS — F640 Transsexualism: Secondary | ICD-10-CM

## 2012-03-14 MED ORDER — SPIRONOLACTONE 100 MG PO TABS: 100.0000 mg | ORAL_TABLET | Freq: Every day | ORAL | Status: DC

## 2012-03-14 NOTE — Progress Notes (Signed)
  Subjective:    Patient ID: Jeff Ross, male    DOB: 15-Nov-1980, 31 y.o.   MRN: [id number]  HPI She is here for follow up of HIV. She returns here today after restarting an HIV regimen with the [place], Norvir and Truvada. She had previously been on Atripla and did well with undetectable viral load and good CD4 count but stopped mainly due to running out of medication and depression. She comes in now on her regimen doing well with no missed doses. 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 She does tell me that she started some hormones that she received over the Internet and took about a few months ago. She does smoke cigarettes occasionally. She does continue to have anxiety and depression.   Review of Systems  Constitutional: Negative for fever, chills, fatigue and unexpected weight change.  HENT: Negative for sore throat and trouble swallowing.   Respiratory: Negative for shortness of breath and stridor.   Cardiovascular: Negative for chest pain, palpitations and leg swelling.  Gastrointestinal: Negative for nausea, abdominal pain and diarrhea.  Musculoskeletal: Negative for myalgias, joint swelling and arthralgias.  Neurological: Negative for dizziness and headaches.       Objective:   Physical Exam  Constitutional: He appears well-developed and well-nourished. No distress.  HENT:  Mouth/Throat: No oropharyngeal exudate.  Cardiovascular: Normal rate, regular rhythm and normal heart sounds.  Exam reveals no gallop and no friction rub.   No murmur heard. Pulmonary/Chest: Effort normal and breath sounds normal. No respiratory distress. He has no wheezes. He has no rales.  Abdominal: Soft. Bowel sounds are normal. He exhibits no distension. There is no tenderness. There is no rebound.          Assessment & Plan:

## 2012-03-14 NOTE — Assessment & Plan Note (Signed)
She is interested in hormone therapy. I did express my concern with hormones in conjunction with tobacco use. I will start her on a low dose of spironolactone and have her followup in 2 months for side effects. At that time I will check a BMP to assure no significant electrolyte abnormalities.

## 2012-03-14 NOTE — Assessment & Plan Note (Signed)
She is doing well and I will have her continue this regimen. She will return for followup for her HIV labs in about 6 months.

## 2012-05-23 ENCOUNTER — Encounter: Payer: Self-pay | Admitting: Internal Medicine

## 2012-05-23 ENCOUNTER — Ambulatory Visit (INDEPENDENT_AMBULATORY_CARE_PROVIDER_SITE_OTHER): Payer: Self-pay | Admitting: Internal Medicine

## 2012-05-23 VITALS — BP 133/68 | HR 98 | Temp 98.5°F | Ht 71.0 in | Wt 181.0 lb

## 2012-05-23 DIAGNOSIS — B20 Human immunodeficiency virus [HIV] disease: Secondary | ICD-10-CM

## 2012-05-23 DIAGNOSIS — F649 Gender identity disorder, unspecified: Secondary | ICD-10-CM

## 2012-05-23 DIAGNOSIS — F642 Gender identity disorder of childhood: Secondary | ICD-10-CM

## 2012-05-23 DIAGNOSIS — F640 Transsexualism: Secondary | ICD-10-CM

## 2012-05-23 NOTE — Progress Notes (Signed)
  Subjective:    Patient ID: Jeff Ross, male    DOB: 07-Mar-1981, 32 y.o.   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 I started her on Spironalactone last visit. She has noted some weight gain and abdominal cramps and some moodiness though this she attributes to difficult situations. She does overall though feels that he is having the desired effect to a small degree and would like to continue. She also has some sharp pain in her lower ribs and shoulder blade that comes and goes.  Review of Systems  Constitutional: Negative for fever, appetite change and fatigue.  Gastrointestinal:       Crampy abdominal pain  Musculoskeletal:       Occasional sharp pains       Objective:   Physical Exam  Constitutional: He appears well-developed and well-nourished. No distress.  Cardiovascular: Normal rate, regular rhythm and normal heart sounds.           Assessment & Plan:

## 2012-05-23 NOTE — Assessment & Plan Note (Signed)
She is doing well with spironolactone. I will check her electrolytes today since she is having some crampy pain and check for any side effects otherwise she will continue and I will follow up with her 2 months for her HIV

## 2012-05-24 LAB — BASIC METABOLIC PANEL WITH GFR
CO2: 30 mEq/L (ref 19–32)
Calcium: 9.8 mg/dL (ref 8.4–10.5)
GFR, Est African American: >89 mL/min
Glucose, Bld: 93 mg/dL (ref 70–99)
Potassium: 4.4 mEq/L (ref 3.5–5.3)
Sodium: 134 mEq/L — ABNORMAL LOW (ref 135–145)

## 2012-05-24 LAB — URINALYSIS, MICROSCOPIC ONLY
Casts: NONE SEEN
Crystals: NONE SEEN
Squamous Epithelial / LPF: NONE SEEN

## 2012-07-14 ENCOUNTER — Telehealth: Payer: Self-pay | Admitting: *Deleted

## 2012-07-14 NOTE — Telephone Encounter (Signed)
Patient called to advise that he is having discharge from his nipples and his penis. He advised it has been coming from his nipples but just started from his penis yesterday and he wants to be seen. Offered him an appt today but he has to find a ride and asked to come tomorrow. Gave him 1045 am with Dr Drue Second 07/15/12.

## 2012-07-15 ENCOUNTER — Ambulatory Visit: Payer: Self-pay

## 2012-07-15 ENCOUNTER — Telehealth: Payer: Self-pay | Admitting: *Deleted

## 2012-07-15 ENCOUNTER — Ambulatory Visit: Payer: Self-pay | Admitting: Internal Medicine

## 2012-07-15 NOTE — Telephone Encounter (Signed)
Made new appt for 07/18/12 @ 0900 w/ Dr. Daiva Eves for problem - drainage from nipples and penis.  Pt verbalized understanding.

## 2012-07-18 ENCOUNTER — Ambulatory Visit: Payer: Self-pay | Admitting: Infectious Disease

## 2012-07-21 ENCOUNTER — Other Ambulatory Visit: Payer: Self-pay | Admitting: Licensed Clinical Social Worker

## 2012-07-21 DIAGNOSIS — B20 Human immunodeficiency virus [HIV] disease: Secondary | ICD-10-CM

## 2012-07-21 MED ORDER — DARUNAVIR ETHANOLATE 800 MG PO TABS: 800.0000 mg | ORAL_TABLET | Freq: Every day | ORAL | Status: DC

## 2012-07-21 MED ORDER — RITONAVIR 100 MG PO TABS: 100.0000 mg | ORAL_TABLET | Freq: Every day | ORAL | Status: DC

## 2012-07-21 MED ORDER — EMTRICITABINE-TENOFOVIR DF 200-300 MG PO TABS: 1.0000 | ORAL_TABLET | Freq: Every day | ORAL | Status: DC

## 2012-11-09 ENCOUNTER — Emergency Department (HOSPITAL_COMMUNITY): Payer: Self-pay

## 2012-11-09 ENCOUNTER — Emergency Department (HOSPITAL_COMMUNITY)
Admission: EM | Admit: 2012-11-09 | Discharge: 2012-11-09 | Disposition: A | Discharge status: Home / Self Care | Payer: Self-pay | Attending: Emergency Medicine | Admitting: Emergency Medicine

## 2012-11-09 ENCOUNTER — Encounter (HOSPITAL_COMMUNITY): Payer: Self-pay | Admitting: Emergency Medicine

## 2012-11-09 DIAGNOSIS — S0101XA Laceration without foreign body of scalp, initial encounter: Secondary | ICD-10-CM

## 2012-11-09 DIAGNOSIS — S0100XA Unspecified open wound of scalp, initial encounter: Secondary | ICD-10-CM | POA: Insufficient documentation

## 2012-11-09 DIAGNOSIS — Z79899 Other long term (current) drug therapy: Secondary | ICD-10-CM | POA: Insufficient documentation

## 2012-11-09 DIAGNOSIS — S60229A Contusion of unspecified hand, initial encounter: Secondary | ICD-10-CM | POA: Insufficient documentation

## 2012-11-09 DIAGNOSIS — F17200 Nicotine dependence, unspecified, uncomplicated: Secondary | ICD-10-CM | POA: Insufficient documentation

## 2012-11-09 DIAGNOSIS — S60221A Contusion of right hand, initial encounter: Secondary | ICD-10-CM

## 2012-11-09 DIAGNOSIS — IMO0002 Reserved for concepts with insufficient information to code with codable children: Secondary | ICD-10-CM | POA: Insufficient documentation

## 2012-11-09 DIAGNOSIS — Z21 Asymptomatic human immunodeficiency virus [HIV] infection status: Secondary | ICD-10-CM | POA: Insufficient documentation

## 2012-11-09 HISTORY — DX: Asymptomatic human immunodeficiency virus (hiv) infection status: Z21

## 2012-11-09 HISTORY — DX: Human immunodeficiency virus (HIV) disease: B20

## 2012-11-09 MED ORDER — OXYCODONE-ACETAMINOPHEN 5-325 MG PO TABS: 1.0000 | ORAL_TABLET | ORAL | Status: DC | PRN

## 2012-11-09 MED ORDER — HYDROCODONE-ACETAMINOPHEN 5-325 MG PO TABS
1.0000 | ORAL_TABLET | Freq: Once | ORAL | Status: AC
  Administered 2012-11-09: 1 via ORAL
  Filled 2012-11-09: qty 1

## 2012-11-09 NOTE — ED Notes (Signed)
Patient was educated not to drive, operate heavy machinery, or drink alcohol while taking narcotic medication.  
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
## 2012-11-09 NOTE — ED Notes (Signed)
Waiting on [facility] ortho tech to apply a right thumb pica cast before discharging patient.

## 2012-11-09 NOTE — ED Notes (Signed)
Patient transported to X-ray 
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
## 2012-11-24 ENCOUNTER — Other Ambulatory Visit: Payer: Self-pay | Admitting: *Deleted

## 2012-11-24 ENCOUNTER — Ambulatory Visit (INDEPENDENT_AMBULATORY_CARE_PROVIDER_SITE_OTHER): Payer: Self-pay | Admitting: Licensed Clinical Social Worker

## 2012-11-24 ENCOUNTER — Other Ambulatory Visit (INDEPENDENT_AMBULATORY_CARE_PROVIDER_SITE_OTHER): Payer: Self-pay

## 2012-11-24 DIAGNOSIS — B20 Human immunodeficiency virus [HIV] disease: Secondary | ICD-10-CM

## 2012-11-24 DIAGNOSIS — Z113 Encounter for screening for infections with a predominantly sexual mode of transmission: Secondary | ICD-10-CM

## 2012-11-24 DIAGNOSIS — R369 Urethral discharge, unspecified: Secondary | ICD-10-CM

## 2012-11-24 DIAGNOSIS — R309 Painful micturition, unspecified: Secondary | ICD-10-CM

## 2012-11-24 LAB — CBC WITH DIFFERENTIAL/PLATELET
Basophils Absolute: 0.1 10*3/uL (ref 0.0–0.1)
Basophils Relative: 1 % (ref 0–1)
MCHC: 33.9 g/dL (ref 30.0–36.0)
Monocytes Absolute: 0.7 10*3/uL (ref 0.1–1.0)
Neutro Abs: 5.4 10*3/uL (ref 1.7–7.7)
Neutrophils Relative %: 58 % (ref 43–77)
Platelets: 314 10*3/uL (ref 150–400)
RDW: 12.2 % (ref 11.5–15.5)
WBC: 9.3 10*3/uL (ref 4.0–10.5)

## 2012-11-24 LAB — COMPLETE METABOLIC PANEL WITH GFR
ALT: 10 U/L (ref 0–53)
AST: 17 U/L (ref 0–37)
Albumin: 4.7 g/dL (ref 3.5–5.2)
Alkaline Phosphatase: 115 U/L (ref 39–117)
BUN: 11 mg/dL (ref 6–23)
Potassium: 3.4 mEq/L — ABNORMAL LOW (ref 3.5–5.3)
Sodium: 138 mEq/L (ref 135–145)

## 2012-11-24 MED ORDER — DARUNAVIR ETHANOLATE 800 MG PO TABS: 800.0000 mg | ORAL_TABLET | Freq: Every day | ORAL | Status: DC

## 2012-11-24 MED ORDER — RITONAVIR 100 MG PO TABS: 100.0000 mg | ORAL_TABLET | Freq: Every day | ORAL | Status: DC

## 2012-11-24 MED ORDER — EMTRICITABINE-TENOFOVIR DF 200-300 MG PO TABS: 1.0000 | ORAL_TABLET | Freq: Every day | ORAL | Status: DC

## 2012-11-24 NOTE — Addendum Note (Signed)
Addended bySteva Colder on: 11/24/2012 03:05 PM   Modules accepted: Orders

## 2012-11-24 NOTE — Progress Notes (Signed)
3 staples removed from patient's scalp. Site was matted from overgrowth of tissue, site cleansed with chlorhexidine and bacitracin. Patient tolerated procedure well.  Patient also had complaints about green penile discharge for 3 days with burning upon urination. Specimen taken for GC and Chlamydia, and we will add RPR to routine labs. Patient advised to return for visit in 2 week and I will call the patient with the results.

## 2012-11-25 LAB — URINALYSIS, ROUTINE W REFLEX MICROSCOPIC
Nitrite: NEGATIVE
Specific Gravity, Urine: 1.028 (ref 1.005–1.030)
pH: 6.5 (ref 5.0–8.0)

## 2012-11-25 LAB — URINALYSIS, MICROSCOPIC ONLY
Squamous Epithelial / LPF: NONE SEEN
WBC, UA: >50 WBC/hpf — AB (ref <3)

## 2012-11-25 LAB — HIV-1 RNA QUANT-NO REFLEX-BLD
HIV 1 RNA Quant: <20 copies/mL (ref <20)
HIV-1 RNA Quant, Log: <1.3 {Log} (ref <1.30)

## 2012-11-25 LAB — RPR

## 2012-11-25 LAB — T-HELPER CELL (CD4) - (RCID CLINIC ONLY): CD4 % Helper T Cell: 37 % (ref 33–55)

## 2012-11-28 ENCOUNTER — Ambulatory Visit (INDEPENDENT_AMBULATORY_CARE_PROVIDER_SITE_OTHER): Payer: Self-pay | Admitting: Licensed Clinical Social Worker

## 2012-11-28 ENCOUNTER — Telehealth: Payer: Self-pay | Admitting: Licensed Clinical Social Worker

## 2012-11-28 DIAGNOSIS — A549 Gonococcal infection, unspecified: Secondary | ICD-10-CM

## 2012-11-28 DIAGNOSIS — A749 Chlamydial infection, unspecified: Secondary | ICD-10-CM

## 2012-11-28 DIAGNOSIS — A5400 Gonococcal infection of lower genitourinary tract, unspecified: Secondary | ICD-10-CM

## 2012-11-28 MED ORDER — AZITHROMYCIN 250 MG PO TABS
1000.0000 mg | ORAL_TABLET | Freq: Once | ORAL | Status: AC
  Administered 2012-11-28: 1000 mg via ORAL

## 2012-11-28 MED ORDER — CEFTRIAXONE SODIUM 1 G IJ SOLR
250.0000 mg | Freq: Once | INTRAMUSCULAR | Status: AC
  Administered 2012-11-28: 250 mg via INTRAMUSCULAR

## 2012-11-28 NOTE — Telephone Encounter (Signed)
Patient has a positive GC test and per Dr. Luciana Axe ok to treat with rocephin and azithromycin. See phone note. I called patient but a friend answered so I left a message with him to call back.

## 2012-11-29 ENCOUNTER — Ambulatory Visit: Payer: Self-pay

## 2012-12-28 ENCOUNTER — Other Ambulatory Visit: Payer: Self-pay | Admitting: *Deleted

## 2012-12-28 DIAGNOSIS — B20 Human immunodeficiency virus [HIV] disease: Secondary | ICD-10-CM

## 2012-12-28 MED ORDER — EMTRICITABINE-TENOFOVIR DF 200-300 MG PO TABS: 1.0000 | ORAL_TABLET | Freq: Every day | ORAL | Status: DC

## 2012-12-28 MED ORDER — RITONAVIR 100 MG PO TABS: 100.0000 mg | ORAL_TABLET | Freq: Every day | ORAL | Status: DC

## 2012-12-28 MED ORDER — SPIRONOLACTONE 100 MG PO TABS: 100.0000 mg | ORAL_TABLET | Freq: Every day | ORAL | Status: DC

## 2012-12-28 MED ORDER — DARUNAVIR ETHANOLATE 800 MG PO TABS: 800.0000 mg | ORAL_TABLET | Freq: Every day | ORAL | Status: DC

## 2013-01-05 ENCOUNTER — Telehealth: Payer: Self-pay | Admitting: *Deleted

## 2013-01-05 ENCOUNTER — Ambulatory Visit: Payer: Self-pay | Admitting: Internal Medicine

## 2013-01-05 NOTE — Telephone Encounter (Signed)
Left message notifying pt of missed appointment. Per Vikki Ports Steps, pt called back and stated she was running 15 minutes late, but would be here.  Pt did not come in. Andree Coss, RN

## 2013-02-22 ENCOUNTER — Telehealth: Payer: Self-pay | Admitting: *Deleted

## 2013-02-22 NOTE — Telephone Encounter (Signed)
Called patient and left message for her to call and schedule a visit for labs and follow up with doctor.

## 2013-07-26 ENCOUNTER — Other Ambulatory Visit: Payer: Self-pay | Admitting: *Deleted

## 2013-07-26 DIAGNOSIS — B20 Human immunodeficiency virus [HIV] disease: Secondary | ICD-10-CM

## 2013-07-27 ENCOUNTER — Ambulatory Visit: Payer: Self-pay

## 2013-07-27 ENCOUNTER — Other Ambulatory Visit: Payer: Self-pay

## 2013-08-02 ENCOUNTER — Encounter: Payer: Self-pay | Admitting: *Deleted

## 2013-08-02 ENCOUNTER — Other Ambulatory Visit: Payer: Self-pay | Admitting: *Deleted

## 2013-08-02 ENCOUNTER — Other Ambulatory Visit (INDEPENDENT_AMBULATORY_CARE_PROVIDER_SITE_OTHER): Payer: Self-pay

## 2013-08-02 DIAGNOSIS — Z113 Encounter for screening for infections with a predominantly sexual mode of transmission: Secondary | ICD-10-CM

## 2013-08-02 DIAGNOSIS — B20 Human immunodeficiency virus [HIV] disease: Secondary | ICD-10-CM

## 2013-08-02 DIAGNOSIS — Z79899 Other long term (current) drug therapy: Secondary | ICD-10-CM

## 2013-08-02 LAB — CBC WITH DIFFERENTIAL/PLATELET
BASOS ABS: 0.1 10*3/uL (ref 0.0–0.1)
BASOS PCT: 1 % (ref 0–1)
EOS ABS: 0.6 10*3/uL (ref 0.0–0.7)
EOS PCT: 10 % — AB (ref 0–5)
HCT: 39.5 % (ref 39.0–52.0)
Hemoglobin: 13.5 g/dL (ref 13.0–17.0)
Lymphocytes Relative: 36 % (ref 12–46)
Lymphs Abs: 2.2 10*3/uL (ref 0.7–4.0)
MCH: 33.7 pg (ref 26.0–34.0)
MCHC: 34.2 g/dL (ref 30.0–36.0)
MCV: 98.5 fL (ref 78.0–100.0)
Monocytes Absolute: 0.4 10*3/uL (ref 0.1–1.0)
Monocytes Relative: 7 % (ref 3–12)
NEUTROS PCT: 46 % (ref 43–77)
Neutro Abs: 2.8 10*3/uL (ref 1.7–7.7)
PLATELETS: 327 10*3/uL (ref 150–400)
RBC: 4.01 MIL/uL — AB (ref 4.22–5.81)
RDW: 12.1 % (ref 11.5–15.5)
WBC: 6 10*3/uL (ref 4.0–10.5)

## 2013-08-02 LAB — COMPLETE METABOLIC PANEL WITH GFR
ALK PHOS: 123 U/L — AB (ref 39–117)
ALT: 12 U/L (ref 0–53)
AST: 14 U/L (ref 0–37)
Albumin: 4.3 g/dL (ref 3.5–5.2)
BILIRUBIN TOTAL: 0.5 mg/dL (ref 0.2–1.2)
BUN: 11 mg/dL (ref 6–23)
CO2: 29 meq/L (ref 19–32)
CREATININE: 1.05 mg/dL (ref 0.50–1.35)
Calcium: 9.2 mg/dL (ref 8.4–10.5)
Chloride: 103 mEq/L (ref 96–112)
GFR, Est Non African American: >89 mL/min
Glucose, Bld: 87 mg/dL (ref 70–99)
Potassium: 4.1 mEq/L (ref 3.5–5.3)
SODIUM: 136 meq/L (ref 135–145)
TOTAL PROTEIN: 7.1 g/dL (ref 6.0–8.3)

## 2013-08-02 MED ORDER — DARUNAVIR ETHANOLATE 800 MG PO TABS: 800.0000 mg | ORAL_TABLET | Freq: Every day | ORAL | Status: DC

## 2013-08-02 MED ORDER — RITONAVIR 100 MG PO TABS: 100.0000 mg | ORAL_TABLET | Freq: Every day | ORAL | Status: DC

## 2013-08-02 MED ORDER — EMTRICITABINE-TENOFOVIR DF 200-300 MG PO TABS: 1.0000 | ORAL_TABLET | Freq: Every day | ORAL | Status: DC

## 2013-08-02 NOTE — Addendum Note (Signed)
Addended by: Jennet MaduroESTRIDGE, Amerah Puleo D on: 08/02/2013 04:05 PM   Modules accepted: Orders

## 2013-08-02 NOTE — Addendum Note (Signed)
Addended by: Lurlean LeydenPOOLE, Happy Begeman F on: 08/02/2013 04:27 PM   Modules accepted: Orders

## 2013-08-03 ENCOUNTER — Other Ambulatory Visit: Payer: Self-pay | Admitting: Licensed Clinical Social Worker

## 2013-08-03 DIAGNOSIS — R369 Urethral discharge, unspecified: Secondary | ICD-10-CM

## 2013-08-03 LAB — CYTOLOGY, (ORAL, ANAL, URETHRAL) ANCILLARY ONLY
CHLAMYDIA, DNA PROBE: NEGATIVE
Chlamydia: NEGATIVE
Neisseria Gonorrhea: NEGATIVE
Neisseria Gonorrhea: NEGATIVE

## 2013-08-03 LAB — HIV-1 RNA QUANT-NO REFLEX-BLD
HIV 1 RNA Quant: <20 {copies}/mL
HIV-1 RNA Quant, Log: <1.3 {Log}

## 2013-08-03 LAB — T-HELPER CELL (CD4) - (RCID CLINIC ONLY)
CD4 % Helper T Cell: 35 % (ref 33–55)
CD4 T Cell Abs: 770 /uL (ref 400–2700)

## 2013-08-03 LAB — RPR

## 2013-08-24 ENCOUNTER — Ambulatory Visit: Payer: Self-pay | Admitting: Internal Medicine

## 2013-08-24 ENCOUNTER — Telehealth: Payer: Self-pay | Admitting: *Deleted

## 2013-08-24 NOTE — Telephone Encounter (Signed)
ROI for Vocational Rehabilitation received.  RN reviewed ROI.  No specific time frame given for releasing information. Phone call to Jeff Ross.  Requested a new ROI be sent with a specific time frame stated so that [facility] copy service will know what information to send.

## 2013-09-12 ENCOUNTER — Ambulatory Visit: Payer: Self-pay | Admitting: Internal Medicine

## 2013-09-12 ENCOUNTER — Telehealth: Payer: Self-pay | Admitting: *Deleted

## 2013-09-12 NOTE — Telephone Encounter (Signed)
Called patient regarding today's appt, he no showed. Today is his 3rd no show and briefly explained on voice mail that he will need to be seen in our walk in clinic. Jeff Ross

## 2014-02-15 ENCOUNTER — Encounter: Payer: Self-pay | Admitting: Internal Medicine

## 2014-02-15 ENCOUNTER — Other Ambulatory Visit: Payer: Self-pay | Admitting: *Deleted

## 2014-02-15 ENCOUNTER — Ambulatory Visit: Payer: Self-pay

## 2014-02-15 ENCOUNTER — Ambulatory Visit (INDEPENDENT_AMBULATORY_CARE_PROVIDER_SITE_OTHER): Payer: Self-pay | Admitting: Internal Medicine

## 2014-02-15 ENCOUNTER — Other Ambulatory Visit: Payer: Self-pay

## 2014-02-15 VITALS — BP 119/70 | HR 88 | Temp 98.2°F | Wt 208.0 lb

## 2014-02-15 DIAGNOSIS — B20 Human immunodeficiency virus [HIV] disease: Secondary | ICD-10-CM

## 2014-02-15 DIAGNOSIS — Z113 Encounter for screening for infections with a predominantly sexual mode of transmission: Secondary | ICD-10-CM

## 2014-02-15 DIAGNOSIS — Z21 Asymptomatic human immunodeficiency virus [HIV] infection status: Secondary | ICD-10-CM

## 2014-02-15 DIAGNOSIS — Z23 Encounter for immunization: Secondary | ICD-10-CM

## 2014-02-15 LAB — CBC WITH DIFFERENTIAL/PLATELET
Basophils Absolute: 0.1 10*3/uL (ref 0.0–0.1)
Basophils Relative: 1 % (ref 0–1)
EOS PCT: 10 % — AB (ref 0–5)
Eosinophils Absolute: 0.8 10*3/uL — ABNORMAL HIGH (ref 0.0–0.7)
HEMATOCRIT: 40.8 % (ref 39.0–52.0)
Hemoglobin: 13.7 g/dL (ref 13.0–17.0)
LYMPHS ABS: 2.5 10*3/uL (ref 0.7–4.0)
Lymphocytes Relative: 30 % (ref 12–46)
MCH: 33.7 pg (ref 26.0–34.0)
MCHC: 33.6 g/dL (ref 30.0–36.0)
MCV: 100.5 fL — AB (ref 78.0–100.0)
MONO ABS: 0.9 10*3/uL (ref 0.1–1.0)
Monocytes Relative: 11 % (ref 3–12)
Neutro Abs: 3.9 10*3/uL (ref 1.7–7.7)
Neutrophils Relative %: 48 % (ref 43–77)
Platelets: 336 10*3/uL (ref 150–400)
RBC: 4.06 MIL/uL — AB (ref 4.22–5.81)
RDW: 12.2 % (ref 11.5–15.5)
WBC: 8.2 10*3/uL (ref 4.0–10.5)

## 2014-02-15 LAB — COMPLETE METABOLIC PANEL WITH GFR
ALT: 18 U/L (ref 0–53)
AST: 17 U/L (ref 0–37)
Albumin: 4.1 g/dL (ref 3.5–5.2)
Alkaline Phosphatase: 125 U/L — ABNORMAL HIGH (ref 39–117)
BUN: 10 mg/dL (ref 6–23)
CALCIUM: 8.8 mg/dL (ref 8.4–10.5)
CO2: 23 meq/L (ref 19–32)
CREATININE: 0.99 mg/dL (ref 0.50–1.35)
Chloride: 106 mEq/L (ref 96–112)
Glucose, Bld: 72 mg/dL (ref 70–99)
Potassium: 4.2 mEq/L (ref 3.5–5.3)
Sodium: 138 mEq/L (ref 135–145)
Total Bilirubin: 0.5 mg/dL (ref 0.2–1.2)
Total Protein: 7.1 g/dL (ref 6.0–8.3)

## 2014-02-15 LAB — RPR TITER: RPR Titer: 1:64 {titer} — AB

## 2014-02-15 LAB — RPR: RPR: REACTIVE — AB

## 2014-02-15 MED ORDER — DARUNAVIR ETHANOLATE 800 MG PO TABS: 800.0000 mg | ORAL_TABLET | Freq: Every day | ORAL | Status: DC

## 2014-02-15 MED ORDER — RITONAVIR 100 MG PO TABS: 100.0000 mg | ORAL_TABLET | Freq: Every day | ORAL | Status: DC

## 2014-02-15 MED ORDER — EMTRICITABINE-TENOFOVIR DF 200-300 MG PO TABS: 1.0000 | ORAL_TABLET | Freq: Every day | ORAL | Status: DC

## 2014-02-15 NOTE — Assessment & Plan Note (Signed)
I discussed the need to stay on medication consistently. She voiced her understanding. She will return in about 5 weeks for labs on medication and I will see her 1-2 weeks after that.

## 2014-02-15 NOTE — Progress Notes (Signed)
  Subjective:    Patient ID: Jeff Ross, male    DOB: 05/11/1980, 33 y.o.   MRN: [id number]  HPI She is here for follow up of HIV. She returns after missing other appointments. She has been off her medicine for about 3 months. She let her Juanell Fairlyyan White and ADAP lapse. She has reapplied today. She has had some weight gain. She does have a rash on her arms is a pinpoint papule and erythema over her scrotum.   Review of Systems  Constitutional: Negative for fever, chills, fatigue and unexpected weight change.  HENT: Negative for sore throat and trouble swallowing.   Respiratory: Negative for shortness of breath and stridor.   Cardiovascular: Negative for chest pain, palpitations and leg swelling.  Gastrointestinal: Negative for nausea, abdominal pain and diarrhea.  Musculoskeletal: Negative for arthralgias, joint swelling and myalgias.  Neurological: Negative for dizziness and headaches.       Objective:   Physical Exam  Constitutional: He appears well-developed and well-nourished. No distress.  HENT:  Mouth/Throat: No oropharyngeal exudate.  Cardiovascular: Normal rate, regular rhythm and normal heart sounds.  Exam reveals no gallop and no friction rub.   No murmur heard. Pulmonary/Chest: Effort normal and breath sounds normal. No respiratory distress. He has no wheezes. He has no rales.  Abdominal: Soft. Bowel sounds are normal. He exhibits no distension. There is no tenderness. There is no rebound.  Genitourinary:  Erythema over left scrotum and a healed ulcer area. No pain or tenderness.          Assessment & Plan:

## 2014-02-16 ENCOUNTER — Ambulatory Visit (INDEPENDENT_AMBULATORY_CARE_PROVIDER_SITE_OTHER): Payer: Self-pay | Admitting: *Deleted

## 2014-02-16 ENCOUNTER — Telehealth: Payer: Self-pay | Admitting: *Deleted

## 2014-02-16 DIAGNOSIS — A539 Syphilis, unspecified: Secondary | ICD-10-CM

## 2014-02-16 LAB — T-HELPER CELL (CD4) - (RCID CLINIC ONLY)
CD4 % Helper T Cell: 35 % (ref 33–55)
CD4 T Cell Abs: 890 /uL (ref 400–2700)

## 2014-02-16 LAB — FLUORESCENT TREPONEMAL AB(FTA)-IGG-BLD: Fluorescent Treponemal ABS: REACTIVE — AB

## 2014-02-16 LAB — URINE CYTOLOGY ANCILLARY ONLY
Chlamydia: NEGATIVE
Neisseria Gonorrhea: NEGATIVE

## 2014-02-16 MED ORDER — PENICILLIN G BENZATHINE 1200000 UNIT/2ML IM SUSP
1.2000 10*6.[IU] | Freq: Once | INTRAMUSCULAR | Status: AC
  Administered 2014-02-16: 1.2 10*6.[IU] via INTRAMUSCULAR

## 2014-02-16 NOTE — Telephone Encounter (Signed)
Patient notified and stated she would be in before 12 pm today to receive treatment. Was notified that we closed 1/2 day on Friday. [name]

## 2014-02-16 NOTE — Telephone Encounter (Signed)
Message copied by Macy MisOCKERHAM, JACQUELINE A on Fri Feb 16, 2014 11:04 AM ------      Message from: Gardiner BarefootOMER, ROBERT W      Created: Fri Feb 16, 2014 10:00 AM       Positive for syphilis.  Needs Bicillin 2.4 million units x 1. thanks ------

## 2014-02-17 LAB — HIV-1 RNA QUANT-NO REFLEX-BLD
HIV 1 RNA QUANT: 292 {copies}/mL — AB (ref <20)
HIV-1 RNA QUANT, LOG: 2.47 {Log} — AB (ref <1.30)

## 2014-03-02 ENCOUNTER — Other Ambulatory Visit: Payer: Self-pay | Admitting: Internal Medicine

## 2014-03-06 ENCOUNTER — Telehealth: Payer: Self-pay | Admitting: *Deleted

## 2014-03-06 DIAGNOSIS — B20 Human immunodeficiency virus [HIV] disease: Secondary | ICD-10-CM

## 2014-03-06 MED ORDER — EMTRICITABINE-TENOFOVIR DF 200-300 MG PO TABS: 1.0000 | ORAL_TABLET | Freq: Every day | ORAL | Status: DC

## 2014-03-06 MED ORDER — DARUNAVIR ETHANOLATE 800 MG PO TABS: 800.0000 mg | ORAL_TABLET | Freq: Every day | ORAL | Status: DC

## 2014-03-06 MED ORDER — RITONAVIR 100 MG PO TABS: 100.0000 mg | ORAL_TABLET | Freq: Every day | ORAL | Status: DC

## 2014-03-06 NOTE — Telephone Encounter (Addendum)
Patient called and advised she spoke with Dr Luciana Axeomer at the last visit about premarin and the doctor agreed to prescribe it for her. Advised no note to that affect and I will have to contact the doctor about this before I can send the Rx in for her. Advised her will call her back once I get an answer.  Patient also wants Aldactone and we have not filled that since 12/2012 with 5 refills.

## 2014-03-07 ENCOUNTER — Other Ambulatory Visit: Payer: Self-pay | Admitting: Internal Medicine

## 2014-03-07 DIAGNOSIS — B20 Human immunodeficiency virus [HIV] disease: Secondary | ICD-10-CM

## 2014-03-07 MED ORDER — ESTRADIOL 0.1 MG/24HR TD PTTW: 1.0000 | MEDICATED_PATCH | TRANSDERMAL | Status: DC

## 2014-03-07 MED ORDER — SPIRONOLACTONE 100 MG PO TABS: 100.0000 mg | ORAL_TABLET | Freq: Every day | ORAL | Status: DC

## 2014-03-07 NOTE — Telephone Encounter (Signed)
Ok to refill 100 mg Aldactone daily and the best would be to use Transdermal estradiol 0.1 mg/24 hour change twice weekly.  I will send it.  thanks

## 2014-03-08 NOTE — Telephone Encounter (Signed)
Called the patient and advised him that the doctor sent in patches and Aldactone to his pharmacy.

## 2014-03-09 NOTE — Telephone Encounter (Signed)
Pt states that the patches are too expensive, $90/month.  Prefers to go back on the Premarin.  MD please advise.

## 2014-03-12 ENCOUNTER — Other Ambulatory Visit: Payer: Self-pay | Admitting: Internal Medicine

## 2014-03-12 ENCOUNTER — Other Ambulatory Visit: Payer: Self-pay

## 2014-03-12 MED ORDER — ESTRADIOL 2 MG PO TABS: 2.0000 mg | ORAL_TABLET | Freq: Every day | ORAL | Status: DC

## 2014-03-12 NOTE — Telephone Encounter (Signed)
[facility]  Pt verbalized understanding.

## 2014-03-12 NOTE — Telephone Encounter (Signed)
Best oral option would be estradiol, which I sent to pharmacy.  If still too expensive, can try 1.25 mg premarin, # 30 5 refills. thanks

## 2014-04-03 ENCOUNTER — Ambulatory Visit (INDEPENDENT_AMBULATORY_CARE_PROVIDER_SITE_OTHER): Payer: Self-pay | Admitting: Internal Medicine

## 2014-04-03 ENCOUNTER — Encounter: Payer: Self-pay | Admitting: Internal Medicine

## 2014-04-03 VITALS — BP 129/78 | HR 79 | Temp 97.5°F | Ht 73.0 in | Wt 200.0 lb

## 2014-04-03 DIAGNOSIS — B20 Human immunodeficiency virus [HIV] disease: Secondary | ICD-10-CM

## 2014-04-03 DIAGNOSIS — F640 Transsexualism: Secondary | ICD-10-CM

## 2014-04-03 DIAGNOSIS — F641 Gender identity disorder in adolescence and adulthood: Secondary | ICD-10-CM

## 2014-04-03 LAB — COMPLETE METABOLIC PANEL WITH GFR
ALBUMIN: 4.3 g/dL (ref 3.5–5.2)
ALK PHOS: 100 U/L (ref 39–117)
ALT: 12 U/L (ref 0–53)
AST: 17 U/L (ref 0–37)
BUN: 11 mg/dL (ref 6–23)
CO2: 25 mEq/L (ref 19–32)
Calcium: 9 mg/dL (ref 8.4–10.5)
Chloride: 103 mEq/L (ref 96–112)
Creat: 0.99 mg/dL (ref 0.50–1.35)
GFR, Est African American: >89 mL/min
GFR, Est Non African American: >89 mL/min
Glucose, Bld: 83 mg/dL (ref 70–99)
Potassium: 4.1 mEq/L (ref 3.5–5.3)
Sodium: 136 mEq/L (ref 135–145)
Total Bilirubin: 0.6 mg/dL (ref 0.2–1.2)
Total Protein: 7.4 g/dL (ref 6.0–8.3)

## 2014-04-03 LAB — CBC WITH DIFFERENTIAL/PLATELET
BASOS ABS: 0.1 10*3/uL (ref 0.0–0.1)
BASOS PCT: 1 % (ref 0–1)
EOS ABS: 0.9 10*3/uL — AB (ref 0.0–0.7)
Eosinophils Relative: 10 % — ABNORMAL HIGH (ref 0–5)
HCT: 38.3 % — ABNORMAL LOW (ref 39.0–52.0)
Hemoglobin: 13.2 g/dL (ref 13.0–17.0)
Lymphocytes Relative: 21 % (ref 12–46)
Lymphs Abs: 1.8 10*3/uL (ref 0.7–4.0)
MCH: 33.8 pg (ref 26.0–34.0)
MCHC: 34.5 g/dL (ref 30.0–36.0)
MCV: 98.2 fL (ref 78.0–100.0)
MPV: 8.9 fL — ABNORMAL LOW (ref 9.4–12.4)
Monocytes Absolute: 0.6 10*3/uL (ref 0.1–1.0)
Monocytes Relative: 7 % (ref 3–12)
NEUTROS PCT: 61 % (ref 43–77)
Neutro Abs: 5.2 10*3/uL (ref 1.7–7.7)
Platelets: 334 10*3/uL (ref 150–400)
RBC: 3.9 MIL/uL — ABNORMAL LOW (ref 4.22–5.81)
RDW: 12.7 % (ref 11.5–15.5)
WBC: 8.5 10*3/uL (ref 4.0–10.5)

## 2014-04-03 NOTE — Assessment & Plan Note (Signed)
We'll check labs today to sure she is suppressed. If it is okay she will return in 3 months.

## 2014-04-03 NOTE — Assessment & Plan Note (Signed)
She is doing okay on estradiol. I will check her electrolytes today.

## 2014-04-03 NOTE — Progress Notes (Signed)
  Subjective:    Patient ID: Jeff Ross, male    DOB: 08-17-1980, 33 y.o.   MRN: [id number]  HPI She is here for follow up of HIV. She returns for a second visit after missing other appointments. She had been off her medicine for about 3 months until restarting in October. She let her Juanell Fairlyyan White and ADAP lapse. Now has been on for over 2 months. She also started estradiol for hormone replacement.   Review of Systems  Constitutional: Negative for fever, chills, fatigue and unexpected weight change.  HENT: Negative for sore throat and trouble swallowing.   Respiratory: Negative for shortness of breath and stridor.   Cardiovascular: Negative for chest pain, palpitations and leg swelling.  Gastrointestinal: Negative for nausea, abdominal pain and diarrhea.  Musculoskeletal: Negative for myalgias, joint swelling and arthralgias.  Neurological: Negative for dizziness and headaches.       Objective:   Physical Exam  Constitutional: He appears well-developed and well-nourished. No distress.  HENT:  Mouth/Throat: No oropharyngeal exudate.  Eyes: No scleral icterus.  Cardiovascular: Normal rate, regular rhythm and normal heart sounds.   Pulmonary/Chest: Effort normal and breath sounds normal. No respiratory distress.  Lymphadenopathy:    He has no cervical adenopathy.  Skin: No rash noted.          Assessment & Plan:

## 2014-04-04 LAB — T-HELPER CELL (CD4) - (RCID CLINIC ONLY)
CD4 T CELL ABS: 610 /uL (ref 400–2700)
CD4 T CELL HELPER: 37 % (ref 33–55)

## 2014-04-04 LAB — HIV-1 RNA QUANT-NO REFLEX-BLD
HIV 1 RNA Quant: <20 copies/mL (ref <20)
HIV-1 RNA Quant, Log: <1.3 {Log} (ref <1.30)

## 2014-05-05 ENCOUNTER — Other Ambulatory Visit: Payer: Self-pay | Admitting: Internal Medicine

## 2014-05-28 ENCOUNTER — Ambulatory Visit: Payer: Self-pay

## 2014-05-29 ENCOUNTER — Ambulatory Visit: Payer: Self-pay

## 2014-05-31 ENCOUNTER — Other Ambulatory Visit: Payer: Self-pay | Admitting: *Deleted

## 2014-05-31 DIAGNOSIS — B20 Human immunodeficiency virus [HIV] disease: Secondary | ICD-10-CM

## 2014-05-31 MED ORDER — EMTRICITABINE-TENOFOVIR DF 200-300 MG PO TABS: 1.0000 | ORAL_TABLET | Freq: Every day | ORAL | Status: DC

## 2014-05-31 MED ORDER — DARUNAVIR ETHANOLATE 800 MG PO TABS: 800.0000 mg | ORAL_TABLET | Freq: Every day | ORAL | Status: DC

## 2014-05-31 MED ORDER — RITONAVIR 100 MG PO TABS: 100.0000 mg | ORAL_TABLET | Freq: Every day | ORAL | Status: DC

## 2014-06-10 ENCOUNTER — Other Ambulatory Visit: Payer: Self-pay | Admitting: Internal Medicine

## 2014-06-10 DIAGNOSIS — B20 Human immunodeficiency virus [HIV] disease: Secondary | ICD-10-CM
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
## 2014-07-03 ENCOUNTER — Encounter: Payer: Self-pay | Admitting: Internal Medicine

## 2014-07-03 ENCOUNTER — Ambulatory Visit (INDEPENDENT_AMBULATORY_CARE_PROVIDER_SITE_OTHER): Payer: Self-pay | Admitting: Internal Medicine

## 2014-07-03 VITALS — Temp 97.6°F | Wt 200.2 lb

## 2014-07-03 DIAGNOSIS — F641 Gender identity disorder in adolescence and adulthood: Secondary | ICD-10-CM

## 2014-07-03 DIAGNOSIS — F640 Transsexualism: Secondary | ICD-10-CM

## 2014-07-03 DIAGNOSIS — Z113 Encounter for screening for infections with a predominantly sexual mode of transmission: Secondary | ICD-10-CM

## 2014-07-03 DIAGNOSIS — B20 Human immunodeficiency virus [HIV] disease: Secondary | ICD-10-CM

## 2014-07-03 LAB — COMPLETE METABOLIC PANEL WITH GFR
ALBUMIN: 4.1 g/dL (ref 3.5–5.2)
ALT: 12 U/L (ref 0–53)
AST: 15 U/L (ref 0–37)
Alkaline Phosphatase: 111 U/L (ref 39–117)
BUN: 9 mg/dL (ref 6–23)
CALCIUM: 9.1 mg/dL (ref 8.4–10.5)
CHLORIDE: 101 meq/L (ref 96–112)
CO2: 28 mEq/L (ref 19–32)
CREATININE: 1.1 mg/dL (ref 0.50–1.35)
GFR, EST NON AFRICAN AMERICAN: 87 mL/min
GLUCOSE: 66 mg/dL — AB (ref 70–99)
POTASSIUM: 3.7 meq/L (ref 3.5–5.3)
Sodium: 137 mEq/L (ref 135–145)
TOTAL PROTEIN: 7.2 g/dL (ref 6.0–8.3)
Total Bilirubin: 0.3 mg/dL (ref 0.2–1.2)

## 2014-07-03 LAB — RPR TITER

## 2014-07-03 LAB — RPR: RPR Ser Ql: REACTIVE — AB

## 2014-07-03 MED ORDER — SPIRONOLACTONE 100 MG PO TABS: 100.0000 mg | ORAL_TABLET | Freq: Every day | ORAL | Status: DC

## 2014-07-03 NOTE — Progress Notes (Signed)
  Subjective:    Patient ID: Nedra Haiobias Langwell, male    DOB: 03/27/1981, 34 y.o.   MRN: [id number]  HPI She is here for follow up of HIV.  She had been off her medicine last year for about 3 months until restarting in October and was undetectable in December.  She also started estradiol for hormone replacement.  He has not been able to get spiral lactone due to cost.   Review of Systems  Constitutional: Negative for fever, chills, fatigue and unexpected weight change.  HENT: Negative for sore throat and trouble swallowing.   Respiratory: Negative for shortness of breath and stridor.   Cardiovascular: Negative for chest pain, palpitations and leg swelling.  Gastrointestinal: Negative for nausea, abdominal pain and diarrhea.  Musculoskeletal: Negative for myalgias, joint swelling and arthralgias.  Neurological: Negative for dizziness and headaches.       Objective:   Physical Exam  Constitutional: He appears well-developed and well-nourished. No distress.  HENT:  Mouth/Throat: No oropharyngeal exudate.  Eyes: No scleral icterus.  Cardiovascular: Normal rate, regular rhythm and normal heart sounds.   Pulmonary/Chest: Effort normal and breath sounds normal. No respiratory distress.  Lymphadenopathy:    He has no cervical adenopathy.  Skin: No rash noted.          Assessment & Plan:

## 2014-07-03 NOTE — Assessment & Plan Note (Signed)
She is doing well with the estradiol however is unable to afford the spironalactone. I did give her a written prescription to shop around to see if she'll find a cheaper.

## 2014-07-03 NOTE — Assessment & Plan Note (Signed)
She is doing well on her regimen and does endorse taking it every day day though sporadic times. I will check her labs today and if okay she can return in 4 months. Otherwise she will be called.

## 2014-07-04 LAB — T-HELPER CELL (CD4) - (RCID CLINIC ONLY)
CD4 % Helper T Cell: 39 % (ref 33–55)
CD4 T CELL ABS: 760 /uL (ref 400–2700)

## 2014-07-04 LAB — FLUORESCENT TREPONEMAL AB(FTA)-IGG-BLD: FLUORESCENT TREPONEMAL ABS: REACTIVE — AB

## 2014-07-05 LAB — HIV-1 RNA QUANT-NO REFLEX-BLD
HIV 1 RNA Quant: <20 copies/mL (ref <20)
HIV-1 RNA Quant, Log: <1.3 {Log} (ref <1.30)

## 2014-09-25 ENCOUNTER — Other Ambulatory Visit: Payer: Self-pay | Admitting: Internal Medicine

## 2014-10-18 ENCOUNTER — Other Ambulatory Visit (INDEPENDENT_AMBULATORY_CARE_PROVIDER_SITE_OTHER): Payer: Self-pay

## 2014-10-18 DIAGNOSIS — B20 Human immunodeficiency virus [HIV] disease: Secondary | ICD-10-CM

## 2014-10-18 LAB — COMPLETE METABOLIC PANEL WITH GFR
ALT: 18 U/L (ref 0–53)
AST: 15 U/L (ref 0–37)
Albumin: 4.5 g/dL (ref 3.5–5.2)
Alkaline Phosphatase: 118 U/L — ABNORMAL HIGH (ref 39–117)
BILIRUBIN TOTAL: 0.5 mg/dL (ref 0.2–1.2)
BUN: 15 mg/dL (ref 6–23)
CO2: 26 meq/L (ref 19–32)
Calcium: 9.4 mg/dL (ref 8.4–10.5)
Chloride: 99 mEq/L (ref 96–112)
Creat: 1.22 mg/dL (ref 0.50–1.35)
GFR, EST AFRICAN AMERICAN: 88 mL/min
GFR, EST NON AFRICAN AMERICAN: 76 mL/min
GLUCOSE: 84 mg/dL (ref 70–99)
Potassium: 4.4 mEq/L (ref 3.5–5.3)
Sodium: 135 mEq/L (ref 135–145)
Total Protein: 8.2 g/dL (ref 6.0–8.3)

## 2014-10-19 LAB — CBC WITH DIFFERENTIAL/PLATELET
BASOS ABS: 0 10*3/uL (ref 0.0–0.1)
BASOS PCT: 0 % (ref 0–1)
EOS ABS: 0.8 10*3/uL — AB (ref 0.0–0.7)
Eosinophils Relative: 8 % — ABNORMAL HIGH (ref 0–5)
HCT: 43.8 % (ref 39.0–52.0)
Hemoglobin: 14.6 g/dL (ref 13.0–17.0)
LYMPHS PCT: 27 % (ref 12–46)
Lymphs Abs: 2.6 10*3/uL (ref 0.7–4.0)
MCH: 34.3 pg — AB (ref 26.0–34.0)
MCHC: 33.3 g/dL (ref 30.0–36.0)
MCV: 102.8 fL — ABNORMAL HIGH (ref 78.0–100.0)
MONO ABS: 0.8 10*3/uL (ref 0.1–1.0)
MONOS PCT: 8 % (ref 3–12)
MPV: 9 fL (ref 8.6–12.4)
NEUTROS ABS: 5.6 10*3/uL (ref 1.7–7.7)
NEUTROS PCT: 57 % (ref 43–77)
Platelets: 351 10*3/uL (ref 150–400)
RBC: 4.26 MIL/uL (ref 4.22–5.81)
RDW: 12.2 % (ref 11.5–15.5)
WBC: 9.8 10*3/uL (ref 4.0–10.5)

## 2014-10-19 LAB — T-HELPER CELL (CD4) - (RCID CLINIC ONLY)
CD4 % Helper T Cell: 33 % (ref 33–55)
CD4 T Cell Abs: 960 /uL (ref 400–2700)

## 2014-10-19 LAB — HIV-1 RNA QUANT-NO REFLEX-BLD: HIV 1 RNA Quant: <20 copies/mL (ref <20)

## 2014-11-01 ENCOUNTER — Encounter: Payer: Self-pay | Admitting: Internal Medicine

## 2014-11-01 ENCOUNTER — Ambulatory Visit (INDEPENDENT_AMBULATORY_CARE_PROVIDER_SITE_OTHER): Payer: Self-pay | Admitting: Internal Medicine

## 2014-11-01 VITALS — BP 116/71 | HR 85 | Temp 98.0°F

## 2014-11-01 DIAGNOSIS — F640 Transsexualism: Secondary | ICD-10-CM

## 2014-11-01 DIAGNOSIS — F641 Gender identity disorder in adolescence and adulthood: Secondary | ICD-10-CM

## 2014-11-01 DIAGNOSIS — B20 Human immunodeficiency virus [HIV] disease: Secondary | ICD-10-CM

## 2014-11-01 NOTE — Progress Notes (Signed)
  Subjective:    Patient ID: Nedra Haiobias Brand, male    DOB: [birth year], 34 y.o.   MRN: [id number]  HPI She is here for follow up of HIV.  She had been off her medicine last year for about 3 months until restarting in October and was undetectable in December.  She also started estradiol for hormone replacement.  On spironalactone as well.    Review of Systems  Constitutional: Negative for fever, chills, fatigue and unexpected weight change.  HENT: Negative for sore throat and trouble swallowing.   Respiratory: Negative for shortness of breath and stridor.   Cardiovascular: Negative for chest pain, palpitations and leg swelling.  Gastrointestinal: Negative for nausea, abdominal pain and diarrhea.  Musculoskeletal: Negative for myalgias, joint swelling and arthralgias.  Neurological: Negative for dizziness and headaches.       Objective:   Physical Exam  Constitutional: He appears well-developed and well-nourished. No distress.  HENT:  Mouth/Throat: No oropharyngeal exudate.  Eyes: No scleral icterus.  Cardiovascular: Normal rate, regular rhythm and normal heart sounds.   Pulmonary/Chest: Effort normal and breath sounds normal. No respiratory distress.  Lymphadenopathy:    He has no cervical adenopathy.  Skin: No rash noted.          Assessment & Plan:

## 2014-11-01 NOTE — Assessment & Plan Note (Signed)
Doing well on regimen. Will consider one pill a day next visit if she remains undetectable wand well controlled.

## 2014-11-01 NOTE — Assessment & Plan Note (Signed)
On estrogen and spironalactone

## 2014-11-06 ENCOUNTER — Other Ambulatory Visit: Payer: Self-pay | Admitting: Internal Medicine

## 2014-12-18 ENCOUNTER — Other Ambulatory Visit: Payer: Self-pay | Admitting: Internal Medicine

## 2014-12-26 NOTE — Progress Notes (Signed)
Notified [facility] by fax. Darely Becknell M, RN 

## 2015-02-20 ENCOUNTER — Other Ambulatory Visit: Payer: Self-pay

## 2015-03-13 ENCOUNTER — Ambulatory Visit: Payer: Self-pay | Admitting: Internal Medicine

## 2015-03-18 ENCOUNTER — Ambulatory Visit: Payer: Self-pay | Admitting: Internal Medicine

## 2015-04-04 ENCOUNTER — Other Ambulatory Visit: Payer: Self-pay | Admitting: Internal Medicine

## 2015-04-04 ENCOUNTER — Other Ambulatory Visit (INDEPENDENT_AMBULATORY_CARE_PROVIDER_SITE_OTHER): Payer: Self-pay

## 2015-04-04 DIAGNOSIS — B20 Human immunodeficiency virus [HIV] disease: Secondary | ICD-10-CM

## 2015-04-04 DIAGNOSIS — Z113 Encounter for screening for infections with a predominantly sexual mode of transmission: Secondary | ICD-10-CM

## 2015-04-04 DIAGNOSIS — F649 Gender identity disorder, unspecified: Secondary | ICD-10-CM

## 2015-04-04 DIAGNOSIS — IMO0001 Reserved for inherently not codable concepts without codable children: Secondary | ICD-10-CM

## 2015-04-05 LAB — HIV-1 RNA QUANT-NO REFLEX-BLD
HIV 1 RNA Quant: <20 copies/mL (ref <20)
HIV-1 RNA Quant, Log: <1.3 Log copies/mL (ref <1.30)

## 2015-04-05 LAB — T-HELPER CELL (CD4) - (RCID CLINIC ONLY)
CD4 % Helper T Cell: 43 % (ref 33–55)
CD4 T Cell Abs: 970 /uL (ref 400–2700)

## 2015-04-05 LAB — CYTOLOGY, (ORAL, ANAL, URETHRAL) ANCILLARY ONLY
CHLAMYDIA, DNA PROBE: NEGATIVE
Chlamydia: NEGATIVE
NEISSERIA GONORRHEA: NEGATIVE
NEISSERIA GONORRHEA: NEGATIVE

## 2015-04-05 LAB — RPR

## 2015-04-05 LAB — URINE CYTOLOGY ANCILLARY ONLY
Chlamydia: NEGATIVE
NEISSERIA GONORRHEA: NEGATIVE

## 2015-08-05 ENCOUNTER — Ambulatory Visit: Payer: Self-pay

## 2015-08-06 ENCOUNTER — Ambulatory Visit: Payer: Self-pay

## 2015-08-06 ENCOUNTER — Other Ambulatory Visit (INDEPENDENT_AMBULATORY_CARE_PROVIDER_SITE_OTHER): Payer: Self-pay

## 2015-08-06 DIAGNOSIS — Z79899 Other long term (current) drug therapy: Secondary | ICD-10-CM

## 2015-08-06 DIAGNOSIS — Z113 Encounter for screening for infections with a predominantly sexual mode of transmission: Secondary | ICD-10-CM

## 2015-08-06 DIAGNOSIS — B20 Human immunodeficiency virus [HIV] disease: Secondary | ICD-10-CM

## 2015-08-06 LAB — CBC WITH DIFFERENTIAL/PLATELET
Basophils Absolute: 72 cells/uL (ref 0–200)
Basophils Relative: 1 %
EOS PCT: 8 %
Eosinophils Absolute: 576 cells/uL — ABNORMAL HIGH (ref 15–500)
HCT: 42.6 % (ref 38.5–50.0)
Hemoglobin: 14.1 g/dL (ref 13.2–17.1)
LYMPHS ABS: 2304 {cells}/uL (ref 850–3900)
LYMPHS PCT: 32 %
MCH: 34.6 pg — ABNORMAL HIGH (ref 27.0–33.0)
MCHC: 33.1 g/dL (ref 32.0–36.0)
MCV: 104.4 fL — ABNORMAL HIGH (ref 80.0–100.0)
MPV: 8.9 fL (ref 7.5–12.5)
Monocytes Absolute: 792 cells/uL (ref 200–950)
Monocytes Relative: 11 %
NEUTROS PCT: 48 %
Neutro Abs: 3456 cells/uL (ref 1500–7800)
PLATELETS: 304 10*3/uL (ref 140–400)
RBC: 4.08 MIL/uL — AB (ref 4.20–5.80)
RDW: 12.4 % (ref 11.0–15.0)
WBC: 7.2 10*3/uL (ref 3.8–10.8)

## 2015-08-07 LAB — COMPREHENSIVE METABOLIC PANEL
ALK PHOS: 117 U/L — AB (ref 40–115)
ALT: 15 U/L (ref 9–46)
AST: 17 U/L (ref 10–40)
Albumin: 4.3 g/dL (ref 3.6–5.1)
BUN: 10 mg/dL (ref 7–25)
CO2: 27 mmol/L (ref 20–31)
CREATININE: 1.06 mg/dL (ref 0.60–1.35)
Calcium: 9.1 mg/dL (ref 8.6–10.3)
Chloride: 103 mmol/L (ref 98–110)
GLUCOSE: 72 mg/dL (ref 65–99)
POTASSIUM: 3.9 mmol/L (ref 3.5–5.3)
SODIUM: 141 mmol/L (ref 135–146)
Total Bilirubin: 0.5 mg/dL (ref 0.2–1.2)
Total Protein: 7.3 g/dL (ref 6.1–8.1)

## 2015-08-07 LAB — T-HELPER CELL (CD4) - (RCID CLINIC ONLY)
CD4 T CELL HELPER: 39 % (ref 33–55)
CD4 T Cell Abs: 970 /uL (ref 400–2700)

## 2015-08-07 LAB — URINE CYTOLOGY ANCILLARY ONLY
Chlamydia: NEGATIVE
Neisseria Gonorrhea: NEGATIVE

## 2015-08-07 LAB — RPR: RPR Ser Ql: REACTIVE — AB

## 2015-08-07 LAB — LIPID PANEL
CHOL/HDL RATIO: 5.5 ratio — AB (ref <=5.0)
Cholesterol: 199 mg/dL (ref 125–200)
HDL: 36 mg/dL — AB (ref >=40)
LDL CALC: 120 mg/dL (ref <130)
Triglycerides: 216 mg/dL — ABNORMAL HIGH (ref <150)
VLDL: 43 mg/dL — ABNORMAL HIGH (ref <30)

## 2015-08-07 LAB — RPR TITER

## 2015-08-08 LAB — FLUORESCENT TREPONEMAL AB(FTA)-IGG-BLD: FLUORESCENT TREPONEMAL ABS: REACTIVE — AB

## 2015-08-08 LAB — HIV-1 RNA QUANT-NO REFLEX-BLD
HIV 1 RNA Quant: <20 copies/mL (ref <20)
HIV-1 RNA Quant, Log: <1.3 Log copies/mL (ref <1.30)

## 2015-08-09 ENCOUNTER — Telehealth: Payer: Self-pay | Admitting: *Deleted

## 2015-08-09 ENCOUNTER — Other Ambulatory Visit: Payer: Self-pay | Admitting: Internal Medicine

## 2015-08-09 NOTE — Telephone Encounter (Signed)
Patient called requesting an Rx for spironolactone. Last seen in July 2016 .Cancelled appt in April; but did come in for lab work. Appt with MD is 09/10/15, please advise. Wendall MolaJacqueline Jenita Rayfield

## 2015-08-12 ENCOUNTER — Other Ambulatory Visit: Payer: Self-pay | Admitting: *Deleted

## 2015-08-12 DIAGNOSIS — B20 Human immunodeficiency virus [HIV] disease: Secondary | ICD-10-CM

## 2015-08-12 MED ORDER — SPIRONOLACTONE 100 MG PO TABS: 100.0000 mg | ORAL_TABLET | Freq: Every day | ORAL | Status: DC

## 2015-08-12 NOTE — Telephone Encounter (Signed)
Rx sent to [facility]

## 2015-08-12 NOTE — Telephone Encounter (Signed)
Ok to refill for 2 months 

## 2015-08-14 ENCOUNTER — Telehealth: Payer: Self-pay | Admitting: *Deleted
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
## 2015-08-15 NOTE — Telephone Encounter (Signed)
If we can't do the swabs without appt can we just treat her for GC/chlamydia?  Rocephin 250 mg IM and 1 gram azithromycin.  thanks

## 2015-08-22 ENCOUNTER — Ambulatory Visit: Payer: Self-pay

## 2015-08-22 NOTE — Telephone Encounter (Signed)
Patient coming today for treatment per Dr. Luciana Axeomer.

## 2015-08-26 ENCOUNTER — Encounter: Payer: Self-pay | Admitting: Internal Medicine

## 2015-08-26 ENCOUNTER — Ambulatory Visit (INDEPENDENT_AMBULATORY_CARE_PROVIDER_SITE_OTHER): Payer: Self-pay | Admitting: *Deleted

## 2015-08-26 DIAGNOSIS — A64 Unspecified sexually transmitted disease: Secondary | ICD-10-CM

## 2015-08-26 MED ORDER — AZITHROMYCIN 250 MG PO TABS: 1000.0000 mg | ORAL_TABLET | Freq: Every day | ORAL | Status: DC

## 2015-08-26 MED ORDER — CEFTRIAXONE SODIUM 1 G IJ SOLR
250.0000 mg | Freq: Once | INTRAMUSCULAR | Status: AC
  Administered 2015-08-26: 250 mg via INTRAMUSCULAR

## 2015-08-26 MED ORDER — AZITHROMYCIN 250 MG PO TABS
1000.0000 mg | ORAL_TABLET | Freq: Once | ORAL | Status: AC
Start: 2015-08-26 — End: 2015-08-26
  Administered 2015-08-26: 1000 mg via ORAL

## 2015-09-10 ENCOUNTER — Ambulatory Visit: Payer: Self-pay | Admitting: Internal Medicine

## 2015-09-17 ENCOUNTER — Encounter: Payer: Self-pay | Admitting: Internal Medicine

## 2015-10-21 ENCOUNTER — Ambulatory Visit: Payer: Self-pay | Admitting: Internal Medicine

## 2015-10-25 ENCOUNTER — Other Ambulatory Visit: Payer: Self-pay | Admitting: Internal Medicine

## 2015-10-25 DIAGNOSIS — B20 Human immunodeficiency virus [HIV] disease: Secondary | ICD-10-CM

## 2015-10-28 ENCOUNTER — Encounter: Payer: Self-pay | Admitting: *Deleted

## 2015-10-28 MED ORDER — RITONAVIR 100 MG PO TABS
100.0000 mg | ORAL_TABLET | Freq: Every day | ORAL | Status: DC
Start: 2015-10-28 — End: 2017-08-11

## 2015-10-28 NOTE — Telephone Encounter (Signed)
error 

## 2015-11-30 ENCOUNTER — Other Ambulatory Visit: Payer: Self-pay | Admitting: Internal Medicine

## 2015-11-30 DIAGNOSIS — B20 Human immunodeficiency virus [HIV] disease: Secondary | ICD-10-CM

## 2015-12-03 ENCOUNTER — Ambulatory Visit: Payer: Self-pay | Admitting: Internal Medicine

## 2015-12-25 ENCOUNTER — Other Ambulatory Visit (INDEPENDENT_AMBULATORY_CARE_PROVIDER_SITE_OTHER): Payer: Self-pay

## 2015-12-25 ENCOUNTER — Other Ambulatory Visit: Payer: Self-pay | Admitting: Internal Medicine

## 2015-12-25 ENCOUNTER — Ambulatory Visit: Payer: Self-pay | Admitting: Internal Medicine

## 2015-12-25 DIAGNOSIS — B20 Human immunodeficiency virus [HIV] disease: Secondary | ICD-10-CM

## 2015-12-25 DIAGNOSIS — Z113 Encounter for screening for infections with a predominantly sexual mode of transmission: Secondary | ICD-10-CM

## 2015-12-25 LAB — CBC WITH DIFFERENTIAL/PLATELET
BASOS PCT: 0 %
Basophils Absolute: 0 cells/uL (ref 0–200)
EOS ABS: 522 {cells}/uL — AB (ref 15–500)
EOS PCT: 3 %
HCT: 41.3 % (ref 38.5–50.0)
Hemoglobin: 13.9 g/dL (ref 13.2–17.1)
Lymphocytes Relative: 14 %
Lymphs Abs: 2436 cells/uL (ref 850–3900)
MCH: 34.4 pg — ABNORMAL HIGH (ref 27.0–33.0)
MCHC: 33.7 g/dL (ref 32.0–36.0)
MCV: 102.2 fL — ABNORMAL HIGH (ref 80.0–100.0)
MONOS PCT: 6 %
MPV: 8.9 fL (ref 7.5–12.5)
Monocytes Absolute: 1044 cells/uL — ABNORMAL HIGH (ref 200–950)
NEUTROS ABS: 13398 {cells}/uL — AB (ref 1500–7800)
Neutrophils Relative %: 77 %
PLATELETS: 326 10*3/uL (ref 140–400)
RBC: 4.04 MIL/uL — AB (ref 4.20–5.80)
RDW: 12.1 % (ref 11.0–15.0)
WBC: 17.4 10*3/uL — AB (ref 3.8–10.8)

## 2015-12-26 LAB — COMPLETE METABOLIC PANEL WITH GFR
ALT: 18 U/L (ref 9–46)
AST: 17 U/L (ref 10–40)
Albumin: 4.9 g/dL (ref 3.6–5.1)
Alkaline Phosphatase: 104 U/L (ref 40–115)
BILIRUBIN TOTAL: 0.6 mg/dL (ref 0.2–1.2)
BUN: 9 mg/dL (ref 7–25)
CHLORIDE: 101 mmol/L (ref 98–110)
CO2: 24 mmol/L (ref 20–31)
CREATININE: 1.02 mg/dL (ref 0.60–1.35)
Calcium: 9.4 mg/dL (ref 8.6–10.3)
GFR, Est African American: >89 mL/min (ref >=60)
GFR, Est Non African American: >89 mL/min (ref >=60)
GLUCOSE: 91 mg/dL (ref 65–99)
Potassium: 3.8 mmol/L (ref 3.5–5.3)
SODIUM: 135 mmol/L (ref 135–146)
TOTAL PROTEIN: 8.3 g/dL — AB (ref 6.1–8.1)

## 2015-12-26 LAB — HIV-1 RNA QUANT-NO REFLEX-BLD
HIV 1 RNA Quant: 28 copies/mL — ABNORMAL HIGH (ref <20)
HIV-1 RNA QUANT, LOG: 1.45 {Log_copies}/mL — AB (ref <1.30)

## 2015-12-26 LAB — RPR TITER: RPR Titer: 1:1 {titer}

## 2015-12-26 LAB — FLUORESCENT TREPONEMAL AB(FTA)-IGG-BLD: Fluorescent Treponemal ABS: REACTIVE — AB

## 2015-12-26 LAB — RPR: RPR: REACTIVE — AB

## 2015-12-27 LAB — URINE CYTOLOGY ANCILLARY ONLY
CHLAMYDIA, DNA PROBE: NEGATIVE
NEISSERIA GONORRHEA: NEGATIVE

## 2015-12-27 LAB — T-HELPER CELL (CD4) - (RCID CLINIC ONLY)
CD4 T CELL HELPER: 32 % — AB (ref 33–55)
CD4 T Cell Abs: 860 /uL (ref 400–2700)

## 2016-01-04 ENCOUNTER — Other Ambulatory Visit: Payer: Self-pay | Admitting: Internal Medicine

## 2016-01-04 DIAGNOSIS — B20 Human immunodeficiency virus [HIV] disease: Secondary | ICD-10-CM

## 2016-01-09 ENCOUNTER — Ambulatory Visit: Payer: Self-pay

## 2016-01-09 ENCOUNTER — Ambulatory Visit: Payer: Self-pay | Admitting: Internal Medicine

## 2016-01-10 ENCOUNTER — Other Ambulatory Visit: Payer: Self-pay | Admitting: Internal Medicine

## 2016-01-10 DIAGNOSIS — B20 Human immunodeficiency virus [HIV] disease: Secondary | ICD-10-CM

## 2017-08-11 ENCOUNTER — Encounter: Payer: Self-pay | Admitting: Internal Medicine

## 2017-08-11 ENCOUNTER — Ambulatory Visit (INDEPENDENT_AMBULATORY_CARE_PROVIDER_SITE_OTHER): Payer: Self-pay | Admitting: Pharmacist

## 2017-08-11 DIAGNOSIS — F640 Transsexualism: Secondary | ICD-10-CM

## 2017-08-11 DIAGNOSIS — Z789 Other specified health status: Secondary | ICD-10-CM

## 2017-08-11 DIAGNOSIS — B20 Human immunodeficiency virus [HIV] disease: Secondary | ICD-10-CM

## 2017-08-11 LAB — CBC WITH DIFFERENTIAL/PLATELET
BASOS PCT: 0.9 %
Basophils Absolute: 51 cells/uL (ref 0–200)
Eosinophils Absolute: 262 cells/uL (ref 15–500)
Eosinophils Relative: 4.6 %
HCT: 38.2 % — ABNORMAL LOW (ref 38.5–50.0)
Hemoglobin: 13 g/dL — ABNORMAL LOW (ref 13.2–17.1)
Lymphs Abs: 1505 cells/uL (ref 850–3900)
MCH: 33.1 pg — ABNORMAL HIGH (ref 27.0–33.0)
MCHC: 34 g/dL (ref 32.0–36.0)
MCV: 97.2 fL (ref 80.0–100.0)
MONOS PCT: 11.9 %
MPV: 9.3 fL (ref 7.5–12.5)
Neutro Abs: 3203 cells/uL (ref 1500–7800)
Neutrophils Relative %: 56.2 %
Platelets: 307 10*3/uL (ref 140–400)
RBC: 3.93 10*6/uL — AB (ref 4.20–5.80)
RDW: 11.5 % (ref 11.0–15.0)
Total Lymphocyte: 26.4 %
WBC mixed population: 678 cells/uL (ref 200–950)
WBC: 5.7 10*3/uL (ref 3.8–10.8)

## 2017-08-11 LAB — LIPID PANEL
Cholesterol: 188 mg/dL (ref <200)
HDL: 54 mg/dL (ref >40)
LDL CHOLESTEROL (CALC): 119 mg/dL — AB
Non-HDL Cholesterol (Calc): 134 mg/dL (calc) — ABNORMAL HIGH (ref <130)
Total CHOL/HDL Ratio: 3.5 (calc) (ref <5.0)
Triglycerides: 61 mg/dL (ref <150)

## 2017-08-11 LAB — COMPREHENSIVE METABOLIC PANEL
AG Ratio: 1.3 (calc) (ref 1.0–2.5)
ALBUMIN MSPROF: 4.3 g/dL (ref 3.6–5.1)
ALT: 16 U/L (ref 9–46)
AST: 16 U/L (ref 10–40)
Alkaline phosphatase (APISO): 105 U/L (ref 40–115)
BUN: 12 mg/dL (ref 7–25)
CHLORIDE: 104 mmol/L (ref 98–110)
CO2: 29 mmol/L (ref 20–32)
CREATININE: 0.9 mg/dL (ref 0.60–1.35)
Calcium: 9.3 mg/dL (ref 8.6–10.3)
GLOBULIN: 3.3 g/dL (ref 1.9–3.7)
GLUCOSE: 96 mg/dL (ref 65–99)
Potassium: 3.9 mmol/L (ref 3.5–5.3)
Sodium: 138 mmol/L (ref 135–146)
Total Bilirubin: 0.6 mg/dL (ref 0.2–1.2)
Total Protein: 7.6 g/dL (ref 6.1–8.1)

## 2017-08-11 MED ORDER — DARUNAVIR-COBICISTAT 800-150 MG PO TABS: 1.0000 | ORAL_TABLET | Freq: Every day | ORAL | 5 refills | Status: DC

## 2017-08-11 MED ORDER — ESTRADIOL 2 MG PO TABS: 2.0000 mg | ORAL_TABLET | Freq: Every day | ORAL | 2 refills | Status: DC

## 2017-08-11 MED ORDER — BICTEGRAVIR-EMTRICITAB-TENOFOV 50-200-25 MG PO TABS: 1.0000 | ORAL_TABLET | Freq: Every day | ORAL | 5 refills | Status: DC

## 2017-08-11 MED ORDER — SPIRONOLACTONE 100 MG PO TABS: 100.0000 mg | ORAL_TABLET | Freq: Every day | ORAL | 2 refills | Status: DC

## 2017-08-11 MED FILL — BIKTARVY 50-200-25 MG TABS: 50-200-25 | 30 days supply | Qty: 30 | Fill #0

## 2017-08-11 NOTE — Progress Notes (Signed)
HPI: "Jeff Ross" Amrit Cress is a 37 y.o. transgender MTF who presents to [facility] for HIV return to care. Last seen in clinic July 2016.  Allergies: No Known Allergies  Vitals:    Past Medical History: Past Medical History:  Diagnosis Date  . HIV (human immunodeficiency virus infection)     Social History: Social History   Socioeconomic History  . Marital status: Single    Spouse name: Not on file  . Number of children: Not on file  . Years of education: Not on file  . Highest education level: Not on file  Occupational History  . Not on file  Social Needs  . Financial resource strain: Not on file  . Food insecurity:    Worry: Not on file    Inability: Not on file  . Transportation needs:    Medical: Not on file    Non-medical: Not on file  Tobacco Use  . Smoking status: Current Every Day Smoker    Packs/day: 0.10    Years: 9.00    Pack years: 0.90    Types: Cigarettes  . Smokeless tobacco: Never Used  . Tobacco comment: "cutting back"  Substance and Sexual Activity  . Alcohol use: Yes    Alcohol/week: 1.2 oz    Types: 2 Glasses of wine per week    Comment: "couple times a week"  . Drug use: No    Frequency: 1.0 times per week  . Sexual activity: Yes    Comment: given condoms  Lifestyle  . Physical activity:    Days per week: Not on file    Minutes per session: Not on file  . Stress: Not on file  Relationships  . Social connections:    Talks on phone: Not on file    Gets together: Not on file    Attends religious service: Not on file    Active member of club or organization: Not on file    Attends meetings of clubs or organizations: Not on file    Relationship status: Not on file  Other Topics Concern  . Not on file  Social History Narrative  . Not on file    Previous Regimen: Atripla  Current Regimen: Truvada + Prezista + Norvir  Labs: HIV 1 RNA Quant (copies/mL)  Date Value  12/25/2015 28 (H)  08/06/2015 <20  04/04/2015 <20    CD4 T Cell Abs (/uL)  Date Value  12/25/2015 860  08/06/2015 970  04/04/2015 970   Hep B S Ab (no units)  Date Value  06/14/2006 YES   Hepatitis B Surface Ag (no units)  Date Value  06/14/2006 NO   HCV Ab (no units)  Date Value  06/14/2006 NO    CrCl: CrCl cannot be calculated (Patient's most recent lab result is older than the maximum 21 days allowed.).  Lipids:    Component Value Date/Time   CHOL 199 08/06/2015 1547   TRIG 216 (H) 08/06/2015 1547   HDL 36 (L) 08/06/2015 1547   CHOLHDL 5.5 (H) 08/06/2015 1547   VLDL 43 (H) 08/06/2015 1547   LDLCALC 120 08/06/2015 1547    Assessment: Jeff Ross returns to clinic for HIV treatment today after prolonged absence (last seen July 2016). She lost her apartment in January and is now homeless and unemployed. ADAP process is underway through [facility] but will take several weeks for approval. She was off of ART for several months last year, and is now taking ART sporadically in an attempt to prevent resistance.  We discussed that noncompliance with daily ART actually increases risk for resistance. She has been very anxious lately and is also experiencing heat flashes, panic attacks and blurred vision that she attributes to needing glasses. She denies specific symptoms of OI including fever, cough, shortness of breath, neck pain.   Due to concern for resistance and need for ADAP, we will start [place] + Prezcobix since she will be able to continue these once ADAP is approved. We also considered [place] but [place] is not on ADAP formulary so would need to be switched once approved. We were able to get her a bottle of Prezcobix to start today, and she will apply for [facility] for further supplies. We counseled Prezcobix must be taken with food. We will obtain Biktarvy through [facility] and Jeff Ross can pick this up from [facility] today.   Recommendations: Discontinue Truvada + Prezista + Norvir Start Prezcobix 1 PO daily + Biktarvy 1  PO daily HIV viral load, CD4, HLAB*5701, CMP, CBC and lipids today Urine, oral and rectal cytology, RPR F/u with pharmacy clinic 5/16 at 10:30 am   Charlton Haws, [place] [facility] Infect[facility] 08/11/17 11:53 AM

## 2017-08-12 LAB — FLUORESCENT TREPONEMAL AB(FTA)-IGG-BLD: Fluorescent Treponemal ABS: REACTIVE — AB

## 2017-08-12 LAB — URINE CYTOLOGY ANCILLARY ONLY
Chlamydia: NEGATIVE
Neisseria Gonorrhea: NEGATIVE

## 2017-08-12 LAB — RPR TITER: RPR Titer: 1:1 {titer} — ABNORMAL HIGH

## 2017-08-12 LAB — CYTOLOGY, (ORAL, ANAL, URETHRAL) ANCILLARY ONLY
CHLAMYDIA, DNA PROBE: POSITIVE — AB
CHLAMYDIA, DNA PROBE: POSITIVE — AB
NEISSERIA GONORRHEA: POSITIVE — AB
Neisseria Gonorrhea: NEGATIVE

## 2017-08-12 LAB — T-HELPER CELL (CD4) - (RCID CLINIC ONLY)
CD4 T CELL HELPER: 33 % (ref 33–55)
CD4 T Cell Abs: 560 /uL (ref 400–2700)

## 2017-08-12 LAB — RPR: RPR: REACTIVE — AB

## 2017-08-13 ENCOUNTER — Other Ambulatory Visit: Payer: Self-pay | Admitting: Pharmacist

## 2017-08-13 ENCOUNTER — Telehealth: Payer: Self-pay | Admitting: *Deleted

## 2017-08-13 DIAGNOSIS — B20 Human immunodeficiency virus [HIV] disease: Secondary | ICD-10-CM

## 2017-08-13 NOTE — Telephone Encounter (Signed)
I agree, does not need treatment for syphilis, just gc and chlamydia.  thanks

## 2017-08-13 NOTE — Telephone Encounter (Signed)
Patient positive for oral chlamydia and gonorrhea and rectal chlamydia as well. Patient's RPR is 1:1 (same as last checked), per Tammy SoursGreg the patient does not need treatment for syphilis.    Patient will need ceftriaxone 250 mg IM x 1 + azithromycin 1 gm PO x 1.  Dr. Luciana Axeomer - let us know if you disagree regarding syphilis treatment.

## 2017-08-13 NOTE — Telephone Encounter (Signed)
Reached patient, relayed results. Updated contact preference. Patient will come Monday at 9:00 for 250mg  ceftriaxone IM and 1 gm azithromycin PO once. Jeff Ross, Jeff Claude M, RN

## 2017-08-13 NOTE — Telephone Encounter (Signed)
Received lab results, please advise on treatment. Andree CossHowell, Taniela Feltus M, RN

## 2017-08-13 NOTE — Telephone Encounter (Signed)
RN called patient, it went to voicemail.  Her outgoing message stated "my phone is off, I can't charge it because my power is off, you'll just have to come by my house." Please advise. Andree CossHowell, Michelle M, RN

## 2017-08-15 ENCOUNTER — Encounter (HOSPITAL_COMMUNITY): Payer: Self-pay

## 2017-08-15 ENCOUNTER — Other Ambulatory Visit: Payer: Self-pay

## 2017-08-15 ENCOUNTER — Emergency Department (HOSPITAL_COMMUNITY)
Admission: EM | Admit: 2017-08-15 | Discharge: 2017-08-15 | Disposition: A | Discharge status: Home / Self Care | Payer: Self-pay | Attending: Emergency Medicine | Admitting: Emergency Medicine

## 2017-08-15 DIAGNOSIS — Z21 Asymptomatic human immunodeficiency virus [HIV] infection status: Secondary | ICD-10-CM | POA: Insufficient documentation

## 2017-08-15 DIAGNOSIS — L500 Allergic urticaria: Secondary | ICD-10-CM | POA: Insufficient documentation

## 2017-08-15 DIAGNOSIS — Z79899 Other long term (current) drug therapy: Secondary | ICD-10-CM | POA: Insufficient documentation

## 2017-08-15 DIAGNOSIS — T7840XA Allergy, unspecified, initial encounter: Secondary | ICD-10-CM

## 2017-08-15 DIAGNOSIS — F17210 Nicotine dependence, cigarettes, uncomplicated: Secondary | ICD-10-CM | POA: Insufficient documentation

## 2017-08-15 DIAGNOSIS — L509 Urticaria, unspecified: Secondary | ICD-10-CM

## 2017-08-15 HISTORY — DX: Other specified health status: Z78.9

## 2017-08-15 HISTORY — DX: Transsexualism: F64.0

## 2017-08-15 MED ORDER — FAMOTIDINE IN NACL 20-0.9 MG/50ML-% IV SOLN
20.0000 mg | Freq: Once | INTRAVENOUS | Status: AC
  Administered 2017-08-15: 20 mg via INTRAVENOUS
  Filled 2017-08-15: qty 50

## 2017-08-15 MED ORDER — DIPHENHYDRAMINE HCL 25 MG PO TABS: 25.0000 mg | ORAL_TABLET | Freq: Three times a day (TID) | ORAL | 0 refills | Status: DC | PRN

## 2017-08-15 MED ORDER — METHYLPREDNISOLONE SODIUM SUCC 125 MG IJ SOLR
125.0000 mg | Freq: Once | INTRAMUSCULAR | Status: AC
  Administered 2017-08-15: 125 mg via INTRAVENOUS
  Filled 2017-08-15: qty 2

## 2017-08-15 MED ORDER — DIPHENHYDRAMINE HCL 50 MG/ML IJ SOLN
25.0000 mg | Freq: Once | INTRAMUSCULAR | Status: AC
  Administered 2017-08-15: 25 mg via INTRAVENOUS
  Filled 2017-08-15: qty 1

## 2017-08-15 MED ORDER — PREDNISONE 20 MG PO TABS: 20.0000 mg | ORAL_TABLET | Freq: Two times a day (BID) | ORAL | 4 refills | Status: DC

## 2017-08-15 MED ORDER — EPINEPHRINE PF 1 MG/ML IJ SOLN
0.3000 mg | Freq: Once | INTRAMUSCULAR | Status: AC
  Administered 2017-08-15: 0.3 mg via SUBCUTANEOUS
  Filled 2017-08-15: qty 1

## 2017-08-15 MED ORDER — EPINEPHRINE 0.3 MG/0.3ML IJ SOAJ: 0.3000 mg | Freq: Once | INTRAMUSCULAR | 0 refills | Status: AC

## 2017-08-15 MED ORDER — ONDANSETRON HCL 4 MG/2ML IJ SOLN
4.0000 mg | Freq: Once | INTRAMUSCULAR | Status: AC
  Administered 2017-08-15: 4 mg via INTRAVENOUS
  Filled 2017-08-15: qty 2

## 2017-08-15 NOTE — Discharge Instructions (Addendum)
Use EpiPen as needed for any future reaction with hives and difficulty breathing.  Benadryl 3 times per day today, and tomorrow.  Disown twice per day today, and tomorrow.

## 2017-08-15 NOTE — ED Triage Notes (Signed)
Pt. States felt a "sting" at left ant. Ankle area; about twenty minutes after which widespread pruritic urticaria formes and persist. EMS gave  of Benadryl en route to hospital. Pt. Arrives with persistent urticaria and is breathing normally.

## 2017-08-15 NOTE — ED Notes (Signed)
She is feeling much better and says she no longer itches. Bus pass issued. She ambulates without difficulty and continues to breath normally.

## 2017-08-15 NOTE — ED Provider Notes (Signed)
[facility]-EMERGENCY DEPT Provider Note   CSN: [id number] Arrival date & time: 08/15/17  8295     History   Chief Complaint Chief Complaint  Patient presents with  . Insect Bite  . Allergic Reaction    HPI Jeff Ross is a 37 y.o. male.  Chief complaint is bite, rash, itching.  HPI 37 year old.  Was bitten by something in a shoe this morning.  Described rash and itching on the inside of the left foot.  Followed within the next 5 to 10 minutes by diffuse itching rash.  No difficulty breathing.  Nausea.  No vomiting.  No lightheadedness or dizziness.  Past Medical History:  Diagnosis Date  . HIV (human immunodeficiency virus infection) (HCC)   . Transgender     Patient Active Problem List   Diagnosis Date Noted  . 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 ANAL FISSURE 01/03/2010  . ONYCHOMYCOSIS, TOENAILS 06/14/2007  . DENTAL CARIES 01/26/2007  . RHINITIS, ALLERGIC NOS 08/31/2006  . Human immunodeficiency virus (HIV) disease (HCC) 05/11/2006    No past surgical history on file.      Home Medications    Prior to Admission medications   Medication Sig Start Date End Date Taking? Authorizing Provider  bictegravir-emtricitabine-tenofovir AF (BIKTARVY) 50-200-25 MG TABS tablet Take 1 tablet by mouth daily. 08/11/17  Yes Kuppelweiser, Cassie L, RPH-CPP  darunavir-cobicistat (PREZCOBIX) 800-150 MG tablet Take 1 tablet by mouth daily with breakfast. Swallow whole. Do NOT crush, break or chew tablets. Take with food. 08/11/17  Yes Kuppelweiser, Cassie L, RPH-CPP  spironolactone (ALDACTONE) 100 MG tablet Take 1 tablet (100 mg total) by mouth daily. 08/11/17  Yes Kuppelweiser, Cassie L, RPH-CPP  diphenhydrAMINE (BENADRYL) 25 MG tablet Take 1 tablet (25 mg total) by mouth every 8 (eight) hours as needed. 08/15/17   Rolland Porter, MD  EPINEPHrine 0.3 mg/0.3 mL IJ SOAJ injection Inject 0.3 mLs (0.3 mg total) into the muscle once for 1 dose. 08/15/17 08/15/17   Rolland Porter, MD  estradiol (ESTRACE) 2 MG tablet Take 1 tablet (2 mg total) by mouth daily. 08/11/17   Kuppelweiser, Cassie L, RPH-CPP  predniSONE (DELTASONE) 20 MG tablet Take 1 tablet (20 mg total) by mouth 2 (two) times daily with a meal. 08/15/17   Rolland Porter, MD    Family History No family history on file.  Social History Social History   Tobacco Use  . Smoking status: Current Every Day Smoker    Packs/day: 0.10    Years: 9.00    Pack years: 0.90    Types: Cigarettes  . Smokeless tobacco: Never Used  . Tobacco comment: "cutting back"  Substance Use Topics  . Alcohol use: Yes    Alcohol/week: 1.2 oz    Types: 2 Glasses of wine per week    Comment: "couple times a week"  . Drug use: No    Frequency: 1.0 times per week     Allergies   Patient has no known allergies.   Review of Systems Review of Systems  Constitutional: Negative for appetite change, chills, diaphoresis, fatigue and fever.  HENT: Negative for mouth sores, sore throat and trouble swallowing.   Eyes: Negative for visual disturbance.  Respiratory: Negative for cough, chest tightness, shortness of breath and wheezing.   Cardiovascular: Negative for chest pain.  Gastrointestinal: Positive for nausea. Negative for abdominal distention, abdominal pain, diarrhea and vomiting.  Endocrine: Negative for polydipsia, polyphagia and polyuria.  Genitourinary: Negative for dysuria, frequency and hematuria.  Musculoskeletal:  Negative for gait problem.  Skin: Positive for rash. Negative for color change and pallor.  Neurological: Negative for dizziness, syncope, light-headedness and headaches.  Hematological: Does not bruise/bleed easily.  Psychiatric/Behavioral: Negative for behavioral problems and confusion.     Physical Exam Updated Vital Signs BP 114/60 (BP Location: Left Arm)   Pulse 67   Temp 98.2 F (36.8 C) (Oral)   Resp 16   SpO2 100%   Physical Exam  Constitutional: He is oriented to person,  place, and time. He appears well-developed and well-nourished. No distress.  No acute distress.  Speaking full sentences.  HENT:  Head: Normocephalic.  Pharynx benign.  No uvular edema.  Normal phonation.  No drooling or stridor.  Eyes: Pupils are equal, round, and reactive to light. Conjunctivae are normal. No scleral icterus.  Neck: Normal range of motion. Neck supple. No thyromegaly present.  Cardiovascular: Normal rate and regular rhythm. Exam reveals no gallop and no friction rub.  No murmur heard. Pulmonary/Chest: Effort normal and breath sounds normal. No respiratory distress. He has no wheezes. He has no rales.  Clear breath sounds.  No wheezing or prolongation.  Abdominal: Soft. Bowel sounds are normal. He exhibits no distension. There is no tenderness. There is no rebound.  Musculoskeletal: Normal range of motion.  Neurological: He is alert and oriented to person, place, and time.  Skin: Skin is warm and dry. No rash noted.  Diffuse urticarial rash.  Psychiatric: He has a normal mood and affect. His behavior is normal.     ED Treatments / Results  Labs (all labs ordered are listed, but only abnormal results are displayed) Labs Reviewed - No data to display  EKG None  Radiology No results found.  Procedures Procedures (including critical care time)  Medications Ordered in ED Medications  famotidine (PEPCID) IVPB 20 mg premix (20 mg Intravenous New Bag/Given 08/15/17 1035)  EPINEPHrine (ADRENALIN) 0.3 mg (0.3 mg Subcutaneous Given 08/15/17 1035)  methylPREDNISolone sodium succinate (SOLU-MEDROL) 125 mg/2 mL injection 125 mg (125 mg Intravenous Given 08/15/17 1036)  ondansetron (ZOFRAN) injection 4 mg (4 mg Intravenous Given 08/15/17 1036)  diphenhydrAMINE (BENADRYL) injection 25 mg (25 mg Intravenous Given 08/15/17 1035)     Initial Impression / Assessment and Plan / ED Course  I have reviewed the triage vital signs and the nursing notes.  Pertinent labs & imaging  results that were available during my care of the patient were reviewed by me and considered in my medical decision making (see chart for details).    Replace.  Given IV Benadryl 25, Pepcid 20, Solu-Medrol 25.  Given subcu epi 0.3.  Placed on full cardiac monitoring.  Given Zofran for nausea.  Observe for 2-1/2 hours.  On multiple rechecks has improving and finally complete resolution of hives.  No recurrence.  No difficult to breathing.  Normal stable vitals.  Discussion, medical decision making.  Appropriate for discharge home.  Will give EpiPen would likely result of the bite.  No unusual food or change in medications.  Return with slow worsening.  EpiPen with any future recurrence.  24 hours of Benadryl, prednisone.  CRITICAL CARE Performed by: Claudean Kinds   Total critical care time: 30 minutes  Critical care time was exclusive of separately billable procedures and treating other patients.  Critical care was necessary to treat or prevent imminent or life-threatening deterioration.  Critical care was time spent personally by me on the following activities: development of treatment plan with patient and/or surrogate as well  as nursing, discussions with consultants, evaluation of patient's response to treatment, examination of patient, obtaining history from patient or surrogate, ordering and performing treatments and interventions, ordering and review of laboratory studies, ordering and review of radiographic studies, pulse oximetry and re-evaluation of patient's condition.   Final Clinical Impressions(s) / ED Diagnoses   Final diagnoses:  Allergic reaction, initial encounter  Hives    ED Discharge Orders        Ordered    predniSONE (DELTASONE) 20 MG tablet  2 times daily with meals     08/15/17 1259    EPINEPHrine 0.3 mg/0.3 mL IJ SOAJ injection   Once     08/15/17 1259    diphenhydrAMINE (BENADRYL) 25 MG tablet  Every 8 hours PRN     08/15/17 1259       Rolland Porter,  MD 08/15/17 1302

## 2017-08-16 ENCOUNTER — Ambulatory Visit (INDEPENDENT_AMBULATORY_CARE_PROVIDER_SITE_OTHER): Payer: Self-pay | Admitting: *Deleted

## 2017-08-16 DIAGNOSIS — A64 Unspecified sexually transmitted disease: Secondary | ICD-10-CM

## 2017-08-16 MED ORDER — CEFTRIAXONE SODIUM 250 MG IJ SOLR
250.0000 mg | Freq: Once | INTRAMUSCULAR | Status: AC
Start: 2017-08-16 — End: 2017-08-16
  Administered 2017-08-16: 250 mg via INTRAMUSCULAR

## 2017-08-16 MED ORDER — AZITHROMYCIN 250 MG PO TABS
250.0000 mg | ORAL_TABLET | Freq: Once | ORAL | Status: AC
Start: 2017-08-16 — End: 2017-08-16
  Administered 2017-08-16: 250 mg via ORAL

## 2017-08-16 NOTE — Progress Notes (Signed)
Per note from [place]/Kuppelweiser patient treated for +STD test see note from 08/13/17

## 2017-08-17 LAB — HLA B*5701: HLA-B*5701 w/rflx HLA-B High: NEGATIVE

## 2017-08-18 ENCOUNTER — Other Ambulatory Visit: Payer: Self-pay | Admitting: *Deleted

## 2017-08-18 DIAGNOSIS — Z91038 Other insect allergy status: Secondary | ICD-10-CM

## 2017-08-18 MED ORDER — EPINEPHRINE 0.3 MG/0.3ML IJ SOAJ: 0.3000 mg | Freq: Once | INTRAMUSCULAR | 3 refills | Status: AC

## 2017-08-24 LAB — HIV-1 INTEGRASE GENOTYPE

## 2017-08-24 LAB — HIV RNA, RTPCR W/R GT (RTI, PI,INT)
HIV 1 RNA Quant: 1890 copies/mL — ABNORMAL HIGH
HIV-1 RNA Quant, Log: 3.28 Log copies/mL — ABNORMAL HIGH

## 2017-08-24 LAB — HIV-1 GENOTYPE: HIV-1 GENOTYPE: DETECTED — AB

## 2017-09-02 ENCOUNTER — Ambulatory Visit: Payer: Self-pay

## 2017-09-29 ENCOUNTER — Ambulatory Visit (INDEPENDENT_AMBULATORY_CARE_PROVIDER_SITE_OTHER): Payer: Self-pay | Admitting: Pharmacist

## 2017-09-29 DIAGNOSIS — B20 Human immunodeficiency virus [HIV] disease: Secondary | ICD-10-CM

## 2017-09-29 MED ORDER — ESTRADIOL 0.1 MG/24HR TD PTWK: 0.2000 mg | MEDICATED_PATCH | TRANSDERMAL | 12 refills | Status: DC

## 2017-09-29 MED ORDER — DARUNAVIR-COBICISTAT 800-150 MG PO TABS: 1.0000 | ORAL_TABLET | Freq: Every day | ORAL | 5 refills | Status: DC

## 2017-09-29 MED ORDER — BICTEGRAVIR-EMTRICITAB-TENOFOV 50-200-25 MG PO TABS: 1.0000 | ORAL_TABLET | Freq: Every day | ORAL | 5 refills | Status: DC

## 2017-09-29 NOTE — Progress Notes (Signed)
HPI: Jeff Ross is a 37 y.o. male who presents to the [facility] pharmacy clinic today for HIV follow-up.  Patient Active Problem List   Diagnosis Date Noted  . 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 ANAL FISSURE 01/03/2010  . ONYCHOMYCOSIS, TOENAILS 06/14/2007  . DENTAL CARIES 01/26/2007  . RHINITIS, ALLERGIC NOS 08/31/2006  . Human immunodeficiency virus (HIV) disease (HCC) 05/11/2006    Patient's Medications  New Prescriptions   ESTRADIOL (CLIMARA - DOSED IN MG/24 HR) 0.1 MG/24HR PATCH    Place 2 patches (0.2 mg total) onto the skin 2 (two) times a week.  Previous Medications   DIPHENHYDRAMINE (BENADRYL) 25 MG TABLET    Take 1 tablet (25 mg total) by mouth every 8 (eight) hours as needed.   PREDNISONE (DELTASONE) 20 MG TABLET    Take 1 tablet (20 mg total) by mouth 2 (two) times daily with a meal.   SPIRONOLACTONE (ALDACTONE) 100 MG TABLET    Take 1 tablet (100 mg total) by mouth daily.  Modified Medications   Modified Medication Previous Medication   BICTEGRAVIR-EMTRICITABINE-TENOFOVIR AF (BIKTARVY) 50-200-25 MG TABS TABLET bictegravir-emtricitabine-tenofovir AF (BIKTARVY) 50-200-25 MG TABS tablet      Take 1 tablet by mouth daily.    Take 1 tablet by mouth daily.   DARUNAVIR-COBICISTAT (PREZCOBIX) 800-150 MG TABLET darunavir-cobicistat (PREZCOBIX) 800-150 MG tablet      Take 1 tablet by mouth daily with breakfast. Swallow whole. Do NOT crush, break or chew tablets. Take with food.    Take 1 tablet by mouth daily with breakfast. Swallow whole. Do NOT crush, break or chew tablets. Take with food.  Discontinued Medications   ESTRADIOL (ESTRACE) 2 MG TABLET    Take 1 tablet (2 mg total) by mouth daily.    Allergies: No Known Allergies  Past Medical History: Past Medical History:  Diagnosis Date  . HIV (human immunodeficiency virus infection) (HCC)   . Transgender     Social History: Social History   Socioeconomic History  . Marital status: Single    Spouse name:  Not on file  . Number of children: Not on file  . Years of education: Not on file  . Highest education level: Not on file  Occupational History  . Not on file  Social Needs  . Financial resource strain: Not on file  . Food insecurity:    Worry: Not on file    Inability: Not on file  . Transportation needs:    Medical: Not on file    Non-medical: Not on file  Tobacco Use  . Smoking status: Current Every Day Smoker    Packs/day: 0.10    Years: 9.00    Pack years: 0.90    Types: Cigarettes  . Smokeless tobacco: Never Used  . Tobacco comment: "cutting back"  Substance and Sexual Activity  . Alcohol use: Yes    Alcohol/week: 1.2 oz    Types: 2 Glasses of wine per week    Comment: "couple times a week"  . Drug use: No    Frequency: 1.0 times per week  . Sexual activity: Yes    Comment: given condoms  Lifestyle  . Physical activity:    Days per week: Not on file    Minutes per session: Not on file  . Stress: Not on file  Relationships  . Social connections:    Talks on phone: Not on file    Gets together: Not on file    Attends religious service: Not on  file    Active member of club or organization: Not on file    Attends meetings of clubs or organizations: Not on file    Relationship status: Not on file  Other Topics Concern  . Not on file  Social History Narrative  . Not on file    Labs: Lab Results  Component Value Date   HIV1RNAQUANT 1,890 (H) 08/11/2017   HIV1RNAQUANT 28 (H) 12/25/2015   HIV1RNAQUANT <20 08/06/2015   CD4TABS 560 08/11/2017   CD4TABS 860 12/25/2015   CD4TABS 970 08/06/2015    RPR and STI Lab Results  Component Value Date   LABRPR REACTIVE (A) 08/11/2017   LABRPR REACTIVE (A) 12/25/2015   LABRPR REACTIVE (A) 08/06/2015   LABRPR NON REAC 04/04/2015   LABRPR REACTIVE (A) 07/03/2014   RPRTITER 1:1 (H) 08/11/2017   RPRTITER 1:1 12/25/2015   RPRTITER 1:1 08/06/2015   RPRTITER 1:8 07/03/2014   RPRTITER 1:64 (A) 02/15/2014    STI  Results GC CT  08/11/2017 Negative Negative  08/11/2017 **POSITIVE**(A) **POSITIVE**(A)  08/11/2017 Negative **POSITIVE**(A)  12/26/2015 Negative Negative  08/06/2015 Negative Negative  04/04/2015 Negative* Negative*  04/04/2015 Negative* Negative*  04/04/2015 Negative Negative  02/15/2014 NG: Negative CT: Negative  08/02/2013 This specimen does not meet the strict criteria set by the FDA. The result interpretation should be considered in conjunction with the patients`s clinical history. -  08/02/2013 NG: Negative* CT: Negative*  08/02/2013 *This specimen does not meet the strict criteria set by the FDA. The result interpretation should be considered in conjuction with the patient`s clinical history. -  08/02/2013 NG: Negative* CT: Negative*    Hepatitis B Lab Results  Component Value Date   HEPBSAB YES 06/14/2006   HEPBSAG NO 06/14/2006   Hepatitis C No results found for: HEPCAB, HCVRNAPCRQN Hepatitis A No results found for: HAV Lipids: Lab Results  Component Value Date   CHOL 188 08/11/2017   TRIG 61 08/11/2017   HDL 54 08/11/2017   CHOLHDL 3.5 08/11/2017   VLDL 43 (H) 08/06/2015   LDLCALC 119 (H) 08/11/2017    Current HIV Regimen: Prezcobix + Biktarvy  Assessment: Jeff Ross is here today to follow-up for her HIV infection.  I recently saw her back in April after being out of care since July 2016.  I restarted her back on Biktarvy and Prezcobix while labs were pending. Her labs did come back with resistance:  Cumulative HIV Genotype Data  Genotype Dates: 08/11/17  RT Mutations  K103R, V179D  PI Mutations   Integrase Mutations    Interpretation of Genotype Data per [employer] HIV Drug Resistance Database:  Nucleoside RTIs  Abacavir - susceptible Zidovudine - susceptible Emtricitabine -  susceptible Lamivudine - susceptible Tenofovir - susceptible   Non-Nucleoside RTIs  Doravirine - susceptible Efavirenz - intermediate resistance Etravirine - low level  resistance Nevirapine - intermediate resistance Rilpivirine - low level resistance   Protease Inhibitors  Atazanavir - susceptible Darunavir - susceptible Lopinavir - susceptible   Integrase Inhibitors  Bictegravir - susceptible Dolutegravir - susceptible Elvitegravir - susceptible Raltegravir - susceptible   She comes in today complaining of her roommate throwing her out of the house last night and her having to sleep on the porch.  She is also complaining of transportation and her feet hurting because she has to walk everywhere.    She claims to have only missed 4 doses since she was restarted on medications back in April.  Her ADAP is approved now, so she will be getting [facility] at [facility]  going forward.  She could technically stop the Prezcobix and only be on Biktarvy, but with her adherence history, I would be worried about stopping the PI. Will continue both for now.  I re-counseled her on the importance of taking them every day and not missing any doses at all.  Told her she already had resistance and to keep taking them everyday to prevent more from developing.  She is requesting estrogen patches instead of the pill, so I will send in some for her as well. She reports some breast tenderness but is happy with the results so far. [facility] and make her a f/u appointment with Dr. Luciana Axeomer for August for follow-up and to renew her ADAP at that time. Encouraged her to continue working with [facility] and continue taking her medications. She was treated for rectal chlamydia, and oral chlamydia/gonorrhea after her last appointment. Will recheck labs today.  Plan: - Continue Biktarvy and Prezcobix - Estradiol 0.1 mg/24hr patch - Apply 2 patches (2 mcg) twice weekly - HIV VL and CD4 today - F/u with Dr. Luciana Axeomer 8/26 at 145pm  Varnell Donate L. Zaylei Mullane, PharmD, AAHIVP, CPP Infectious Diseases Clinical Pharmacist [facility] 09/29/2017, 4:22  PM

## 2017-09-30 LAB — T-HELPER CELL (CD4) - (RCID CLINIC ONLY)
CD4 % Helper T Cell: 35 % (ref 33–55)
CD4 T CELL ABS: 600 /uL (ref 400–2700)

## 2017-10-01 LAB — HIV-1 RNA QUANT-NO REFLEX-BLD
HIV 1 RNA Quant: <20 copies/mL
HIV-1 RNA Quant, Log: <1.3 Log copies/mL

## 2017-12-13 ENCOUNTER — Ambulatory Visit: Payer: Self-pay | Admitting: Internal Medicine

## 2017-12-14 ENCOUNTER — Other Ambulatory Visit: Payer: Self-pay | Admitting: Pharmacist

## 2017-12-14 DIAGNOSIS — F640 Transsexualism: Secondary | ICD-10-CM

## 2017-12-14 DIAGNOSIS — B20 Human immunodeficiency virus [HIV] disease: Secondary | ICD-10-CM

## 2017-12-14 DIAGNOSIS — Z789 Other specified health status: Secondary | ICD-10-CM

## 2017-12-14 MED ORDER — SPIRONOLACTONE 100 MG PO TABS: 100.0000 mg | ORAL_TABLET | Freq: Every day | ORAL | 2 refills | Status: DC

## 2017-12-22 ENCOUNTER — Other Ambulatory Visit: Payer: Self-pay

## 2017-12-22 ENCOUNTER — Other Ambulatory Visit: Payer: Self-pay | Admitting: *Deleted

## 2017-12-22 DIAGNOSIS — B20 Human immunodeficiency virus [HIV] disease: Secondary | ICD-10-CM

## 2017-12-23 ENCOUNTER — Encounter: Payer: Self-pay | Admitting: Internal Medicine

## 2018-01-03 ENCOUNTER — Ambulatory Visit: Payer: Self-pay | Admitting: Internal Medicine

## 2018-01-05 ENCOUNTER — Encounter: Payer: Self-pay | Admitting: Internal Medicine

## 2018-01-06 ENCOUNTER — Encounter: Payer: Self-pay | Admitting: Internal Medicine

## 2018-01-10 ENCOUNTER — Other Ambulatory Visit: Payer: Self-pay

## 2018-01-26 ENCOUNTER — Telehealth: Payer: Self-pay | Admitting: *Deleted

## 2018-01-26 ENCOUNTER — Encounter: Payer: Self-pay | Admitting: Internal Medicine

## 2018-01-26 NOTE — Telephone Encounter (Signed)
Patient has no-showed Dr Luciana Axe 10+ times, should not be scheduled with Dr Luciana Axe. Please schedule future appointments with NP per Dr Luciana Axe. Andree Coss, RN

## 2018-02-18 ENCOUNTER — Other Ambulatory Visit: Payer: Self-pay

## 2018-02-18 DIAGNOSIS — B20 Human immunodeficiency virus [HIV] disease: Secondary | ICD-10-CM

## 2018-02-18 LAB — T-HELPER CELL (CD4) - (RCID CLINIC ONLY)
CD4 % Helper T Cell: 34 % (ref 33–55)
CD4 T Cell Abs: 630 /uL (ref 400–2700)

## 2018-02-21 LAB — URINE CYTOLOGY ANCILLARY ONLY
Chlamydia: NEGATIVE
Neisseria Gonorrhea: NEGATIVE

## 2018-02-21 LAB — RPR: RPR Ser Ql: NONREACTIVE

## 2018-02-21 LAB — HIV-1 RNA QUANT-NO REFLEX-BLD
HIV 1 RNA Quant: 130 copies/mL — ABNORMAL HIGH
HIV-1 RNA QUANT, LOG: 2.11 {Log_copies}/mL — AB

## 2018-03-11 ENCOUNTER — Other Ambulatory Visit: Payer: Self-pay | Admitting: Pharmacist

## 2018-03-11 DIAGNOSIS — B20 Human immunodeficiency virus [HIV] disease: Secondary | ICD-10-CM

## 2018-03-11 MED ORDER — BICTEGRAVIR-EMTRICITAB-TENOFOV 50-200-25 MG PO TABS: 1.0000 | ORAL_TABLET | Freq: Every day | ORAL | 1 refills | Status: DC

## 2018-03-14 ENCOUNTER — Other Ambulatory Visit: Payer: Self-pay | Admitting: *Deleted

## 2018-03-14 DIAGNOSIS — B20 Human immunodeficiency virus [HIV] disease: Secondary | ICD-10-CM

## 2018-03-14 MED ORDER — DARUNAVIR-COBICISTAT 800-150 MG PO TABS: 1.0000 | ORAL_TABLET | Freq: Every day | ORAL | 1 refills | Status: DC

## 2018-03-30 ENCOUNTER — Ambulatory Visit (INDEPENDENT_AMBULATORY_CARE_PROVIDER_SITE_OTHER): Payer: Self-pay | Admitting: Infectious Diseases

## 2018-03-30 ENCOUNTER — Encounter: Payer: Self-pay | Admitting: Infectious Diseases

## 2018-03-30 ENCOUNTER — Ambulatory Visit (INDEPENDENT_AMBULATORY_CARE_PROVIDER_SITE_OTHER): Payer: Self-pay | Admitting: Licensed Clinical Social Worker

## 2018-03-30 VITALS — BP 120/65 | HR 83 | Temp 98.3°F | Ht 71.5 in | Wt 183.0 lb

## 2018-03-30 DIAGNOSIS — F640 Transsexualism: Secondary | ICD-10-CM

## 2018-03-30 DIAGNOSIS — Z79899 Other long term (current) drug therapy: Secondary | ICD-10-CM

## 2018-03-30 DIAGNOSIS — B20 Human immunodeficiency virus [HIV] disease: Secondary | ICD-10-CM

## 2018-03-30 DIAGNOSIS — F331 Major depressive disorder, recurrent, moderate: Secondary | ICD-10-CM

## 2018-03-30 DIAGNOSIS — IMO0001 Reserved for inherently not codable concepts without codable children: Secondary | ICD-10-CM

## 2018-03-30 DIAGNOSIS — F418 Other specified anxiety disorders: Secondary | ICD-10-CM

## 2018-03-30 MED ORDER — BICTEGRAVIR-EMTRICITAB-TENOFOV 50-200-25 MG PO TABS: 1.0000 | ORAL_TABLET | Freq: Every day | ORAL | 1 refills | Status: DC

## 2018-03-30 MED ORDER — DARUNAVIR-COBICISTAT 800-150 MG PO TABS: 1.0000 | ORAL_TABLET | Freq: Every day | ORAL | 1 refills | Status: DC

## 2018-03-30 NOTE — Progress Notes (Signed)
Name: Jeff Ross  DOB: Jul 03, 1980 MRN: [id number] PCP: Patient, No Pcp Per   Patient Active Problem List   Diagnosis Date Noted  . 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 ANAL FISSURE 01/03/2010  . ONYCHOMYCOSIS, TOENAILS 06/14/2007  . DENTAL CARIES 01/26/2007  . RHINITIS, ALLERGIC NOS 08/31/2006  . Human immunodeficiency virus (HIV) disease (HCC) 05/11/2006     Brief Narrative:  Jeff Ross is a 37 y.o. transfemale with HIV disease. History of OIs: none known. HIV Risk: MSM/TG.   Previous Regimens:  Prezista + Norvir + Truvada 2016 . Biktarvy + Prezcobix 07/2017   Genotypes: . 07/2017: RT mutations K103R, V179D (low to intermediate resistance to all NNRTI class except Doravirine, S-INSTI, PIs, NRTIs).   Subjective:  CC:  Follow up HIV care after 3 years off medications - just resumed in April 2019. Previously seen by Dr. Luciana Axe.   HPI:  Jeff Ross describes herself to be "just a mess" today. Since her last office visit with Dr. Luciana Axe in 2016 she has unfortunately been kicked out of her apartment in January 2019 due to "technical reasons" (property damage and too many inhabitants). She has been working with our clinical pharmacy team since April of this year to get re-established on medications and started on Biktarvy + Prezcobix. She is out of one of them (not certain how only one) and has not picked up any medications recently from pharmacy. She tells me she is very inconsistent with her medications and cites this is due to being homeless. She feels that if she was in a stable home environment she would be better like she once was. Reports no complaints today suggestive of associated opportunistic infection or advancing HIV disease such as fevers, night sweats, weight loss, anorexia, cough, SOB, nausea, vomiting, diarrhea, headache, sensory changes, lymphadenopathy or oral thrush.   She is on gender  affirming hormones currently using spironolactone, estradiol patches (2 patches twice a week) but admits she also uses Premarin occasionally to help "boost effect." Has some breast tenderness today. She smokes cigarettes daily 1/2 ppd. She has had multiple male sexual partners over the last 6 months with all sites exposed. Intermittent condom use. Sleep is poor (2-3 hours at night but sometimes up all night) and mixed depressed/anxious moods.    Review of Systems  Constitutional: Negative for chills, fever, malaise/fatigue and weight loss.  HENT: Negative for sore throat.        No dental problems  Respiratory: Negative for cough and sputum production.   Cardiovascular: Negative for chest pain and leg swelling.  Gastrointestinal: Negative for abdominal pain, diarrhea and vomiting.  Genitourinary: Negative for dysuria and flank pain.  Musculoskeletal: Negative for joint pain, myalgias and neck pain.  Skin: Negative for rash.  Neurological: Negative for dizziness, tingling and headaches.  Psychiatric/Behavioral: Negative for depression and substance abuse. The patient is not nervous/anxious and does not have insomnia.     Past Medical History:  Diagnosis Date  . HIV (human immunodeficiency virus infection) (HCC)   . Transgender     Outpatient Medications Prior to Visit  Medication Sig Dispense Refill  . diphenhydrAMINE (BENADRYL) 25 MG tablet Take 1 tablet (25 mg total) by mouth every 8 (eight) hours as needed. (Patient not taking: Reported on 03/30/2018) 6 tablet 0  . bictegravir-emtricitabine-tenofovir AF (BIKTARVY) 50-200-25 MG TABS tablet Take 1 tablet by mouth daily. (Patient not taking: Reported on 03/30/2018) 30 tablet 1  . darunavir-cobicistat (PREZCOBIX)  800-150 MG tablet Take 1 tablet by mouth daily with breakfast. Swallow whole. Do NOT crush, break or chew tablets. Take with food. (Patient not taking: Reported on 03/30/2018) 30 tablet 1  . estradiol (CLIMARA - DOSED IN MG/24 HR)  0.1 mg/24hr patch Place 2 patches (0.2 mg total) onto the skin 2 (two) times a week. (Patient not taking: Reported on 03/30/2018) 16 patch 12  . predniSONE (DELTASONE) 20 MG tablet Take 1 tablet (20 mg total) by mouth 2 (two) times daily with a meal. (Patient not taking: Reported on 03/30/2018) 2 tablet 4  . spironolactone (ALDACTONE) 100 MG tablet Take 1 tablet (100 mg total) by mouth daily. (Patient not taking: Reported on 03/30/2018) 30 tablet 2   No facility-administered medications prior to visit.      No Known Allergies  Social History   Tobacco Use  . Smoking status: Current Every Day Smoker    Packs/day: 0.10    Years: 9.00    Pack years: 0.90    Types: Cigarettes  . Smokeless tobacco: Never Used  . Tobacco comment: "cutting back"  Substance Use Topics  . Alcohol use: Yes    Alcohol/week: 2.0 standard drinks    Types: 2 Glasses of wine per week    Comment: "couple times a week"  . Drug use: No    Frequency: 1.0 times per week    Social History   Substance and Sexual Activity  Sexual Activity Yes  . Partners: Male  . Birth control/protection: Condom   Comment: given condoms     Objective:   Vitals:   03/30/18 1553  BP: 120/65  Pulse: 83  Temp: 98.3 F (36.8 C)  TempSrc: Oral  Weight: 183 lb (83 kg)  Height: 5' 11.5" (1.816 m)   Body mass index is 25.17 kg/m.  Physical Exam Constitutional:      Comments: Seated comfortably on exam table. In no distress. Feminine appearing.   HENT:     Mouth/Throat:     Mouth: Mucous membranes are moist. No oral lesions.     Dentition: Normal dentition. No dental caries.     Pharynx: Oropharynx is clear.  Eyes:     General: No scleral icterus.    Pupils: Pupils are equal, round, and reactive to light.  Cardiovascular:     Rate and Rhythm: Normal rate and regular rhythm.     Heart sounds: Normal heart sounds.  Pulmonary:     Effort: Pulmonary effort is normal.     Breath sounds: Normal breath sounds.    Abdominal:     General: There is no distension.     Palpations: Abdomen is soft.     Tenderness: There is no abdominal tenderness.  Lymphadenopathy:     Cervical: No cervical adenopathy.  Skin:    General: Skin is warm and dry.     Findings: No rash.  Neurological:     Mental Status: He is alert and oriented to person, place, and time.  Psychiatric:        Mood and Affect: Mood is anxious. Affect is not inappropriate.        Speech: Speech is tangential.        Behavior: Behavior is cooperative.        Thought Content: Thought content is not paranoid or delusional. Thought content does not include suicidal ideation.        Cognition and Memory: Cognition normal.        Judgment: Judgment normal.  Lab Results Lab Results  Component Value Date   WBC 5.7 08/11/2017   HGB 13.0 (L) 08/11/2017   HCT 38.2 (L) 08/11/2017   MCV 97.2 08/11/2017   PLT 307 08/11/2017    Lab Results  Component Value Date   CREATININE 0.94 03/30/2018   BUN 10 03/30/2018   NA 139 03/30/2018   K 3.7 03/30/2018   CL 103 03/30/2018   CO2 28 03/30/2018    Lab Results  Component Value Date   ALT 57 (H) 03/30/2018   AST 36 03/30/2018   ALKPHOS 104 12/25/2015   BILITOT 0.5 03/30/2018    Lab Results  Component Value Date   CHOL 188 08/11/2017   HDL 54 08/11/2017   LDLCALC 119 (H) 08/11/2017   TRIG 61 08/11/2017   CHOLHDL 3.5 08/11/2017   HIV 1 RNA Quant (copies/mL)  Date Value  03/30/2018 2,460 (H)  02/18/2018 130 (H)  09/29/2017 <20 NOT DETECTED   CD4 T Cell Abs (/uL)  Date Value  02/18/2018 630  09/29/2017 600  08/11/2017 560     Assessment & Plan:   Problem List Items Addressed This Visit      Unprioritized   Human immunodeficiency virus (HIV) disease (HCC) - Primary    Poor adherence in the setting of homelessness which has been ongoing for nearly a year. She is working with [employer] for [employer]. Will continue current regimen of Biktarvy + Prezcobix with  concern for acquired resistance in the setting of sub-optimal use. Counseled on this today. She is potentially interested in [place] inject[place] however not certain she is going to get here timely to receive injections with transportation difficulties. Check VL/CD4 today. Screen RPR and oral/rectal/urine GC/C.  Return in about 4 weeks (around 04/27/2018) for follow up, labs.       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 Will hold on refill of current regimen with inappropriate use of adding Premarin to regimen. Discussed increased risk of VTE with conjugated estrogens and need to stick with one regimen. She has been on and off hormones 2 years now. Will need routine mammograms for screening breast cancer at 43 years if she continues on therapy. PSA at upcoming lab draw.       Relevant Orders   Estradiol (Completed)   Testosterone Total,Free,Bio, Males-([facility]) (Completed)   Comprehensive metabolic panel (Completed)   Mixed anxiety depressive disorder    Exhibiting signs of mania today (decreased sleep, flighty thoughts). Referral to [facility] today. Marthann Schiller is also working with her to get her back into [facility] care.        Other Visit Diagnoses    HIV disease (HCC)       Relevant Medications   darunavir-cobicistat (PREZCOBIX) 800-150 MG tablet   bictegravir-emtricitabine-tenofovir AF (BIKTARVY) 50-200-25  MG TABS tablet   spironolactone (ALDACTONE) 100 MG tablet   estradiol (CLIMARA - DOSED IN MG/24 HR) 0.1 mg/24hr patch (Start on 04/04/2018)     Return in about 4  weeks (around 04/27/2018) for follow up, labs.   Rexene AlbertsStephanie , MSN, NP-C [facility] Pager: [phone] Office: [phone]  04/03/18  4:06 PM

## 2018-03-30 NOTE — Patient Instructions (Addendum)
Nice to meet you today!   Please stop taking Premarin - only the patches. Will send in more patches once I get your blood work back to make sure the dose is OK.   Will need to get you back on your Biktarvy + Prezcobix daily with food.   Please come back in 1 month.

## 2018-03-31 ENCOUNTER — Other Ambulatory Visit: Payer: Self-pay | Admitting: Infectious Diseases

## 2018-03-31 NOTE — Progress Notes (Signed)
Testosterone and estrogen within target range - will send in more patches and spiro. She has elevated ALT - will need hepatitis testing at next lab draw to evaluate with r/f acquisition through sex, homeless and possibly drug use.

## 2018-03-31 NOTE — BH Specialist Note (Signed)
Integrated Behavioral Health Initial Visit  MRN: [id number] Name: Jeff Haiobias Wain  Number of Integrated Behavioral Health Clinician visits:: 1/6 Session Start time: 4:43pm Session End time: 5:06pm Total time: 20 minutes  Type of Service: Integrated Behavioral Health- Individual/Family Interpretor:No. Interpretor Name and Language: n/a   Warm Hand Off Completed.       SUBJECTIVE: Jeff Ross is a 37 y.o. transgender male accompanied by self Patient was referred by Dwain SarnaMitch McGee for depressive symptoms. Patient reports the following symptoms/concerns: depressed mood, lack of self-care practices, irritability, lack of motivation Duration of problem: unknown; Severity of problem: moderate  OBJECTIVE: Mood: Depressed and Affect: Constricted Risk of harm to self or others: No plan to harm self or others   LIFE CONTEXT: Patient reports being homeless/unsheltered and having little to no support. She indicates that this is her first time being homeless, and that she is currently camping in the woods. Patient states that she often finds herself becoming angry and at odds with people in her life, but she believes this is often necessary to keep herself safe.   GOALS ADDRESSED: Patient will: 1. Reduce symptoms of: depression  INTERVENTIONS: Interventions utilized: Motivational Interviewing and Supportive Counseling   ASSESSMENT: Patient currently experiencing depressed mood, mood instability, irritability lack of motivation. The most consistent diagnosis at this time is Major Depressive Disorder, Recurrent, Moderate. Patient reports that she is feeling more depressed since being homeless. Counselor and patient explored ways that patient is coping with this situation. Counselor guided patient to identify goals for herself. Patient's goals are mostly based on getting a place to stay and being approved for disability income. Counselor brainstormed with patient ways to move toward her goal of  finding housing. Counselor encouraged patient to celebrate her progress and focus on moving forward. Counselor educated patient on counseling services available at [facility], including scope of practice, scheduling, and accessibility procedures. Patient states she is unsure whether she is interested in seeking counseling at this time.    Patient may benefit from ongoing CBT sessions to address depressive symptoms.  PLAN: 1. Patient will call to schedule follow up.  Angus Palmsegina , LCSW

## 2018-04-01 LAB — COMPREHENSIVE METABOLIC PANEL
AG Ratio: 1.1 (calc) (ref 1.0–2.5)
ALT: 57 U/L — AB (ref 9–46)
AST: 36 U/L (ref 10–40)
Albumin: 4.1 g/dL (ref 3.6–5.1)
Alkaline phosphatase (APISO): 115 U/L (ref 40–115)
BUN: 10 mg/dL (ref 7–25)
CO2: 28 mmol/L (ref 20–32)
CREATININE: 0.94 mg/dL (ref 0.60–1.35)
Calcium: 9.2 mg/dL (ref 8.6–10.3)
Chloride: 103 mmol/L (ref 98–110)
GLUCOSE: 85 mg/dL (ref 65–99)
Globulin: 3.7 g/dL (calc) (ref 1.9–3.7)
POTASSIUM: 3.7 mmol/L (ref 3.5–5.3)
SODIUM: 139 mmol/L (ref 135–146)
Total Bilirubin: 0.5 mg/dL (ref 0.2–1.2)
Total Protein: 7.8 g/dL (ref 6.1–8.1)

## 2018-04-01 LAB — RPR TITER: RPR Titer: 1:1 {titer} — ABNORMAL HIGH

## 2018-04-01 LAB — CYTOLOGY, (ORAL, ANAL, URETHRAL) ANCILLARY ONLY
CHLAMYDIA, DNA PROBE: NEGATIVE
Chlamydia: POSITIVE — AB
Neisseria Gonorrhea: NEGATIVE
Neisseria Gonorrhea: NEGATIVE

## 2018-04-01 LAB — TESTOSTERONE TOTAL,FREE,BIO, MALES
Albumin: 4.1 g/dL (ref 3.6–5.1)
Sex Hormone Binding: 18 nmol/L (ref 10–50)
TESTOSTERONE BIOAVAILABLE: 99.4 ng/dL — AB (ref >=110.0)
TESTOSTERONE FREE: 52.8 pg/mL (ref 46.0–224.0)
Testosterone: 260 ng/dL (ref 250–827)

## 2018-04-01 LAB — URINE CYTOLOGY ANCILLARY ONLY
Chlamydia: NEGATIVE
Neisseria Gonorrhea: NEGATIVE

## 2018-04-01 LAB — HIV-1 RNA QUANT-NO REFLEX-BLD
HIV 1 RNA Quant: 2460 copies/mL — ABNORMAL HIGH
HIV-1 RNA QUANT, LOG: 3.39 {Log_copies}/mL — AB

## 2018-04-01 LAB — FLUORESCENT TREPONEMAL AB(FTA)-IGG-BLD: Fluorescent Treponemal ABS: REACTIVE — AB

## 2018-04-01 LAB — ESTRADIOL: Estradiol: 53 pg/mL — ABNORMAL HIGH (ref <=39)

## 2018-04-01 LAB — RPR: RPR Ser Ql: REACTIVE — AB

## 2018-04-03 DIAGNOSIS — F418 Other specified anxiety disorders: Secondary | ICD-10-CM | POA: Insufficient documentation

## 2018-04-03 MED ORDER — ESTRADIOL 0.1 MG/24HR TD PTWK: 0.2000 mg | MEDICATED_PATCH | TRANSDERMAL | 2 refills | Status: DC

## 2018-04-03 MED ORDER — SPIRONOLACTONE 100 MG PO TABS: 100.0000 mg | ORAL_TABLET | Freq: Every day | ORAL | 2 refills | Status: DC

## 2018-04-03 NOTE — Assessment & Plan Note (Signed)
Check CMP, estradiol and testosterone levels today. Will hold on refill of current regimen with inappropriate use of adding Premarin to regimen. Discussed increased risk of VTE with conjugated estrogens and need to stick with one regimen. She has been on and off hormones 2 years now. Will need routine mammograms for screening breast cancer at 43 years if she continues on therapy. PSA at upcoming lab draw.

## 2018-04-03 NOTE — Assessment & Plan Note (Signed)
Poor adherence in the setting of homelessness which has been ongoing for nearly a year. She is working with [employer]Mitch for [employer], drillingresource assistance/allocation. Will continue current regimen of Biktarvy + Prezcobix with concern for acquired resistance in the setting of sub-optimal use. Counseled on this today. She is potentially interested in [place] inject[place] however not certain she is going to get here timely to receive injections with transportation difficulties. Check VL/CD4 today. Screen RPR and oral/rectal/urine GC/C.  Return in about 4 weeks (around 04/27/2018) for follow up, labs.

## 2018-04-03 NOTE — Progress Notes (Signed)
Please call Jeff Ross (may need to schedule with [name]) to let her know that her rectal chlamydia returned positive and her syphilis titer is positive again - she needs treatment with Bicillin 2.4 million units IM x 1 and 1 gm azithromycin PO x 1 please. Partners over the last 6 months need to be notified and referred to health department for testing/treatment. No sex until she is treated and for 7 days. Please provide condoms at upcoming nurse visit too.  Thank you.

## 2018-04-03 NOTE — Assessment & Plan Note (Signed)
Exhibiting signs of mania today (decreased sleep, flighty thoughts). [facility] Marthann SchillerMitch is also working with her to get her back into [facility] care.

## 2018-04-05 ENCOUNTER — Telehealth: Payer: Self-pay | Admitting: *Deleted

## 2018-04-05 NOTE — Telephone Encounter (Signed)
Relayed to Marthann SchillerMitch and Selena BattenKim, who are transporting/seeing the patient on Wednesday for research intake screening.  Appointment scheduled for nurse visit after research. Andree CossHowell, Addelynn Batte M, RN

## 2018-04-05 NOTE — Telephone Encounter (Signed)
-----   Message from Blanchard KelchStephanie N Dixon, NP sent at 04/03/2018  4:11 PM EST ----- Please call Jeff Ross (may need to schedule with [name]) to let her know that her rectal chlamydia returned positive and her syphilis titer is positive again - she needs treatment with Bicillin 2.4 million units IM x 1 and 1 gm azithromycin PO x 1 please. Partners over the last 6 months need to be notified and referred to health department for testing/treatment. No sex until she is treated and for 7 days. Please provide condoms at upcoming nurse visit too.  Thank you.

## 2018-04-06 ENCOUNTER — Encounter (INDEPENDENT_AMBULATORY_CARE_PROVIDER_SITE_OTHER): Payer: Self-pay | Admitting: *Deleted

## 2018-04-06 ENCOUNTER — Ambulatory Visit (INDEPENDENT_AMBULATORY_CARE_PROVIDER_SITE_OTHER): Payer: Self-pay | Admitting: Behavioral Health

## 2018-04-06 VITALS — BP 128/72 | HR 79 | Temp 97.5°F | Wt 182.2 lb

## 2018-04-06 DIAGNOSIS — A64 Unspecified sexually transmitted disease: Secondary | ICD-10-CM

## 2018-04-06 DIAGNOSIS — A539 Syphilis, unspecified: Secondary | ICD-10-CM

## 2018-04-06 DIAGNOSIS — Z006 Encounter for examination for normal comparison and control in clinical research program: Secondary | ICD-10-CM

## 2018-04-06 DIAGNOSIS — A749 Chlamydial infection, unspecified: Secondary | ICD-10-CM

## 2018-04-06 MED ORDER — PENICILLIN G BENZATHINE 1200000 UNIT/2ML IM SUSP
1.2000 10*6.[IU] | Freq: Once | INTRAMUSCULAR | Status: AC
  Administered 2018-04-06: 1.2 10*6.[IU] via INTRAMUSCULAR

## 2018-04-06 MED ORDER — AZITHROMYCIN 250 MG PO TABS
1000.0000 mg | ORAL_TABLET | Freq: Once | ORAL | Status: AC
  Administered 2018-04-06: 1000 mg via ORAL

## 2018-04-06 NOTE — Research (Signed)
"  Jeff Ross" was here today to screen for the A5359 study on using [employer] incentives and injectables ARVs for nonadherence in HIV. She recently came back in to care (last week) after being gone for 6 months. Her VL was over 2000 when checked last week and a genotype was sent off on it. She said she had not been taking her meds consistently before then for at least 6 months. Prior to that she had not been seen at all for all of 2018. She is a transwoman and currently on estradiol and spironolactone and another med for anxiety she takes at bedtime, but could not remember what it was. She was diagnosed back in 2007. Informed consent was obtained via our site's SOP guidelines. She has some college at [employer] but never finished her degree. She still needs her EKG and PE done by Dr. Daiva EvesVan Dam on 04/22/2018.

## 2018-04-06 NOTE — Addendum Note (Signed)
Addended by: Phill MyronEPPERSON, Ladarius Seubert C on: 04/06/2018 03:36 PM   Modules accepted: Orders

## 2018-04-06 NOTE — Progress Notes (Signed)
Patient came in today to be treated for syphilis and rectal Chlamydia. Patient tolerated Bicillin injection and oral Azithromycin.  Patient offered condoms and accepted.  Informed her to refrain from sexual activity for 7 days and inform partners from the last 6 months to be tested and treated.  Patient verbalized understanding. Angeline SlimAshley Gizel Riedlinger RN

## 2018-04-07 LAB — URINALYSIS, ROUTINE W REFLEX MICROSCOPIC
Bilirubin Urine: NEGATIVE
Glucose, UA: NEGATIVE
Hgb urine dipstick: NEGATIVE
KETONES UR: NEGATIVE
LEUKOCYTES UA: NEGATIVE
NITRITE: NEGATIVE
PH: 7 (ref 5.0–8.0)
Protein, ur: NEGATIVE
SPECIFIC GRAVITY, URINE: 1.023 (ref 1.001–1.03)

## 2018-04-07 LAB — COMPLETE METABOLIC PANEL WITH GFR
AG RATIO: 1.2 (calc) (ref 1.0–2.5)
ALKALINE PHOSPHATASE (APISO): 110 U/L (ref 40–115)
ALT: 17 U/L (ref 9–46)
AST: 12 U/L (ref 10–40)
Albumin: 4.3 g/dL (ref 3.6–5.1)
BILIRUBIN TOTAL: 0.4 mg/dL (ref 0.2–1.2)
BUN: 13 mg/dL (ref 7–25)
CHLORIDE: 101 mmol/L (ref 98–110)
CO2: 29 mmol/L (ref 20–32)
Calcium: 9.3 mg/dL (ref 8.6–10.3)
Creat: 1.01 mg/dL (ref 0.60–1.35)
GFR, Est African American: 108 mL/min/{1.73_m2} (ref >=60)
GFR, Est Non African American: 93 mL/min/{1.73_m2} (ref >=60)
GLUCOSE: 58 mg/dL — AB (ref 65–99)
Globulin: 3.7 g/dL (calc) (ref 1.9–3.7)
POTASSIUM: 3.7 mmol/L (ref 3.5–5.3)
Sodium: 136 mmol/L (ref 135–146)
Total Protein: 8 g/dL (ref 6.1–8.1)

## 2018-04-07 LAB — HEPATITIS B SURFACE ANTIBODY,QUALITATIVE: Hep B S Ab: NONREACTIVE

## 2018-04-07 LAB — HEPATITIS B CORE ANTIBODY, IGM: HEP B C IGM: NONREACTIVE

## 2018-04-07 LAB — BILIRUBIN, DIRECT: Bilirubin, Direct: 0.1 mg/dL (ref 0.0–0.2)

## 2018-04-07 LAB — HEPATITIS C ANTIBODY
HEP C AB: NONREACTIVE
SIGNAL TO CUT-OFF: 0.07 (ref <1.00)

## 2018-04-07 LAB — HEPATITIS B SURFACE ANTIGEN: Hepatitis B Surface Ag: NONREACTIVE

## 2018-04-08 LAB — HIV-1/2 AB - DIFFERENTIATION
HIV 1 ANTIBODY: POSITIVE — AB
HIV 2 AB: NEGATIVE

## 2018-04-08 LAB — HIV ANTIBODY (ROUTINE TESTING W REFLEX): HIV: REACTIVE — AB

## 2018-04-10 LAB — HIV-1 INTEGRASE GENOTYPE

## 2018-04-10 LAB — HIV-1 GENOTYPE: HIV-1 Genotype: DETECTED — AB

## 2018-04-22 ENCOUNTER — Encounter (INDEPENDENT_AMBULATORY_CARE_PROVIDER_SITE_OTHER): Payer: Self-pay | Admitting: Infectious Disease

## 2018-04-22 VITALS — BP 111/69 | HR 80 | Temp 98.4°F | Wt 173.5 lb

## 2018-04-22 DIAGNOSIS — Z006 Encounter for examination for normal comparison and control in clinical research program: Secondary | ICD-10-CM

## 2018-04-22 NOTE — Progress Notes (Addendum)
Subjective:  Complaint: She is here for her CPE exam prior to entry into A CTG trial  Patient ID: Jeff Ross, male    DOB: [birth year], 38 y.o.   MRN: [id number]  HPI  Jeff Ross is a man's gender male who is 38 years old and had HIV since 2008 having initially failed Atripla with resistance.  She has had intermittent adherence to her antiretrovirals in part due to unstable life issues including times where she has been homeless.  She is currently been on Biktarvy plus Prezcobix  For CPE exam prior to entry into the LATTITUDE study.  Past Medical History:  Diagnosis Date  . HIV (human immunodeficiency virus infection) (HCC)   . Transgender     No past surgical history on file.  No family history on file.    Social History   Socioeconomic History  . Marital status: Single    Spouse name: Not on file  . Number of children: Not on file  . Years of education: Not on file  . Highest education level: Not on file  Occupational History  . Not on file  Social Needs  . Financial resource strain: Not on file  . Food insecurity:    Worry: Not on file    Inability: Not on file  . Transportation needs:    Medical: Not on file    Non-medical: Not on file  Tobacco Use  . Smoking status: Current Every Day Smoker    Packs/day: 0.10    Years: 9.00    Pack years: 0.90    Types: Cigarettes  . Smokeless tobacco: Never Used  . Tobacco comment: "cutting back"  Substance and Sexual Activity  . Alcohol use: Yes    Alcohol/week: 2.0 standard drinks    Types: 2 Glasses of wine per week    Comment: "couple times a week"  . Drug use: No    Frequency: 1.0 times per week  . Sexual activity: Yes    Partners: Male    Birth control/protection: Condom    Comment: given condoms  Lifestyle  . Physical activity:    Days per week: Not on file    Minutes per session: Not on file  . Stress: Not on file  Relationships  . Social connections:    Talks on phone: Not on file    Gets together:  Not on file    Attends religious service: Not on file    Active member of club or organization: Not on file    Attends meetings of clubs or organizations: Not on file    Relationship status: Not on file  Other Topics Concern  . Not on file  Social History Narrative  . Not on file    No Known Allergies   Current Outpatient Medications:  .  bictegravir-emtricitabine-tenofovir AF (BIKTARVY) 50-200-25 MG TABS tablet, Take 1 tablet by mouth daily., Disp: 30 tablet, Rfl: 1 .  darunavir-cobicistat (PREZCOBIX) 800-150 MG tablet, Take 1 tablet by mouth daily with breakfast. Swallow whole. Do NOT crush, break or chew tablets. Take with food., Disp: 30 tablet, Rfl: 1 .  diphenhydrAMINE (BENADRYL) 25 MG tablet, Take 1 tablet (25 mg total) by mouth every 8 (eight) hours as needed. (Patient not taking: Reported on 03/30/2018), Disp: 6 tablet, Rfl: 0 .  estradiol (CLIMARA - DOSED IN MG/24 HR) 0.1 mg/24hr patch, Place 2 patches (0.2 mg total) onto the skin 2 (two) times a week., Disp: 16 patch, Rfl: 2 .  spironolactone (ALDACTONE) 100 MG tablet,  Take 1 tablet (100 mg total) by mouth daily., Disp: 30 tablet, Rfl: 2   Review of Systems  Constitutional: Negative for activity change, appetite change, chills, diaphoresis, fatigue, fever and unexpected weight change.  HENT: Negative for congestion, rhinorrhea, sinus pressure, sneezing, sore throat and trouble swallowing.   Eyes: Negative for photophobia and visual disturbance.  Respiratory: Negative for cough, chest tightness, shortness of breath, wheezing and stridor.   Cardiovascular: Negative for chest pain, palpitations and leg swelling.  Gastrointestinal: Negative for abdominal distention, abdominal pain, anal bleeding, blood in stool, constipation, diarrhea, nausea and vomiting.  Genitourinary: Negative for difficulty urinating, dysuria, flank pain and hematuria.  Musculoskeletal: Negative for arthralgias, back pain, gait problem, joint swelling and  myalgias.  Skin: Negative for color change, pallor, rash and wound.  Neurological: Negative for dizziness, tremors, weakness, light-headedness and headaches.  Hematological: Negative for adenopathy. Does not bruise/bleed easily.  Psychiatric/Behavioral: Negative for agitation, behavioral problems, confusion, decreased concentration, dysphoric mood, sleep disturbance and suicidal ideas.       Objective:   Physical Exam Constitutional:      General: He is not in acute distress.    Appearance: Normal appearance. He is well-developed. He is not ill-appearing or diaphoretic.  HENT:     Head: Normocephalic and atraumatic.     Right Ear: Hearing and external ear normal.     Left Ear: Hearing and external ear normal.     Nose: Nose normal. No nasal deformity or rhinorrhea.     Mouth/Throat:     Mouth: Mucous membranes are dry.     Pharynx: Oropharynx is clear. No oropharyngeal exudate or posterior oropharyngeal erythema.  Eyes:     General: No scleral icterus.    Extraocular Movements: Extraocular movements intact.     Conjunctiva/sclera: Conjunctivae normal.     Right eye: Right conjunctiva is not injected.     Left eye: Left conjunctiva is not injected.     Pupils: Pupils are equal, round, and reactive to light.  Neck:     Musculoskeletal: Normal range of motion and neck supple.     Vascular: No JVD.  Cardiovascular:     Rate and Rhythm: Normal rate and regular rhythm.     Heart sounds: Normal heart sounds, S1 normal and S2 normal. No murmur. No friction rub. No gallop.   Pulmonary:     Effort: Pulmonary effort is normal. No respiratory distress.     Breath sounds: Normal breath sounds. No stridor. No wheezing, rhonchi or rales.  Abdominal:     General: Bowel sounds are normal. There is no distension.     Palpations: Abdomen is soft. There is no mass.     Tenderness: There is no abdominal tenderness.  Musculoskeletal: Normal range of motion.        General: No swelling,  tenderness, deformity or signs of injury.     Right shoulder: Normal.     Left shoulder: Normal.     Right hip: Normal.     Left hip: Normal.     Right knee: Normal.     Left knee: Normal.     Right lower leg: No edema.  Lymphadenopathy:     Head:     Right side of head: No submandibular, preauricular or posterior auricular adenopathy.     Left side of head: No submandibular, preauricular or posterior auricular adenopathy.     Cervical: No cervical adenopathy.     Right cervical: No superficial or deep cervical adenopathy.  Left cervical: No superficial or deep cervical adenopathy.  Skin:    General: Skin is warm and dry.     Coloration: Skin is not pale.     Findings: No abrasion, bruising, ecchymosis, erythema, lesion or rash.     Nails: There is no clubbing.   Neurological:     General: No focal deficit present.     Mental Status: He is alert and oriented to person, place, and time.     Cranial Nerves: No cranial nerve deficit.     Sensory: No sensory deficit.     Motor: No weakness.     Coordination: Coordination normal.     Gait: Gait normal.  Psychiatric:        Attention and Perception: He is attentive.        Mood and Affect: Mood normal.        Speech: Speech normal.        Behavior: Behavior normal. Behavior is cooperative.        Thought Content: Thought content normal.        Judgment: Judgment normal.           Assessment & Plan:  Jeff Ross is very much interested in the study.  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 It does not give a mutation score to K238T but states in text that this mutation "may reduce s to RPV"  We will double check with study team re these mutations which are not major RPV mutations. She has no hx of INSTI mutations.     She could likely be very well suppressed on SYMTUZA or PREZCOBIX  DESCOVY but for now we will continue her on her regimen of Prezcobix and Biktarvy.  She has a normal exam  I have otherwise reviewed her labs and her EKG which is normal sinus rhythm without any evidence of ischemic changes.

## 2018-05-02 ENCOUNTER — Encounter (INDEPENDENT_AMBULATORY_CARE_PROVIDER_SITE_OTHER): Payer: Self-pay | Admitting: *Deleted

## 2018-05-02 ENCOUNTER — Ambulatory Visit: Payer: Self-pay | Admitting: Infectious Diseases

## 2018-05-02 ENCOUNTER — Ambulatory Visit (INDEPENDENT_AMBULATORY_CARE_PROVIDER_SITE_OTHER): Payer: Self-pay | Admitting: Pharmacist

## 2018-05-02 VITALS — BP 124/75 | HR 80 | Temp 97.9°F | Resp 16 | Wt 177.8 lb

## 2018-05-02 DIAGNOSIS — B20 Human immunodeficiency virus [HIV] disease: Secondary | ICD-10-CM

## 2018-05-02 DIAGNOSIS — Z006 Encounter for examination for normal comparison and control in clinical research program: Secondary | ICD-10-CM

## 2018-05-02 NOTE — Research (Signed)
Jeff Ross came in today for entry on the [facility] Study for nonadherent patients who are HIV positive. After eligibility was confirmed she was enrolled on the study. She has decided to stay on her current regimen of Biktarvy and Prezcobix which she is getting through [facility] assistance. She has been instructed to bring them with her every visit so that we can do pill counts and assess her adherence. She did forget to bring them today. She denies any new problems or concerns but did seem a bit down. She will be seeing Judeth Cornfield later today for a regular scheduled visit and did agree to start seeing Rene Kocher for counseling. She had missed a dose of meds 3 days ago due to hectic life. She will be returniong for study in 2 weeks.

## 2018-05-02 NOTE — Progress Notes (Signed)
HPI: Jeff Ross is a 38 y.o. male who presents to the [facility] clinic today for HIV follow-up.  Patient Active Problem List   Diagnosis Date Noted  . 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 ANAL FISSURE 01/03/2010  . ONYCHOMYCOSIS, TOENAILS 06/14/2007  . DENTAL CARIES 01/26/2007  . RHINITIS, ALLERGIC NOS 08/31/2006  . Human immunodeficiency virus (HIV) disease (HCC) 05/11/2006    Patient's Medications  New Prescriptions   No medications on file  Previous Medications   BICTEGRAVIR-EMTRICITABINE-TENOFOVIR AF (BIKTARVY) 50-200-25 MG TABS TABLET    Take 1 tablet by mouth daily.   DARUNAVIR-COBICISTAT (PREZCOBIX) 800-150 MG TABLET    Take 1 tablet by mouth daily with breakfast. Swallow whole. Do NOT crush, break or chew tablets. Take with food.   DIPHENHYDRAMINE (BENADRYL) 25 MG TABLET    Take 1 tablet (25 mg total) by mouth every 8 (eight) hours as needed.   ESTRADIOL (CLIMARA - DOSED IN MG/24 HR) 0.1 MG/24HR PATCH    Place 2 patches (0.2 mg total) onto the skin 2 (two) times a week.   SPIRONOLACTONE (ALDACTONE) 100 MG TABLET    Take 1 tablet (100 mg total) by mouth daily.  Modified Medications   No medications on file  Discontinued Medications   No medications on file    Allergies: No Known Allergies  Past Medical History: Past Medical History:  Diagnosis Date  . HIV (human immunodeficiency virus infection) (HCC)   . Transgender     Social History: Social History   Socioeconomic History  . Marital status: Single    Spouse name: Not on file  . Number of children: Not on file  . Years of education: Not on file  . Highest education level: Not on file  Occupational History  . Not on file  Social Needs  . Financial resource strain: Not on file  . Food insecurity:    Worry: Not on file    Inability: Not on file  . Transportation needs:    Medical: Not on file    Non-medical: Not on file    Tobacco Use  . Smoking status: Current Every Day Smoker    Packs/day: 0.10    Years: 9.00    Pack years: 0.90    Types: Cigarettes  . Smokeless tobacco: Never Used  . Tobacco comment: "cutting back"  Substance and Sexual Activity  . Alcohol use: Yes    Alcohol/week: 2.0 standard drinks    Types: 2 Glasses of wine per week    Comment: "couple times a week"  . Drug use: No    Frequency: 1.0 times per week  . Sexual activity: Yes    Partners: Male    Birth control/protection: Condom    Comment: given condoms  Lifestyle  . Physical activity:    Days per week: Not on file    Minutes per session: Not on file  . Stress: Not on file  Relationships  . Social connections:    Talks on phone: Not on file    Gets together: Not on file    Attends religious service: Not on file    Active member of club or organization: Not on file    Attends meetings of clubs or organizations: Not on file    Relationship status: Not on file  Other Topics Concern  . Not on file  Social History Narrative  . Not on file    Labs: Lab Results  Component Value  Date   HIV1RNAQUANT 2,460 (H) 03/30/2018   HIV1RNAQUANT 130 (H) 02/18/2018   HIV1RNAQUANT <20 NOT DETECTED 09/29/2017   CD4TABS 630 02/18/2018   CD4TABS 600 09/29/2017   CD4TABS 560 08/11/2017    RPR and STI Lab Results  Component Value Date   LABRPR REACTIVE (A) 03/30/2018   LABRPR NON-REACTIVE 02/18/2018   LABRPR REACTIVE (A) 08/11/2017   LABRPR REACTIVE (A) 12/25/2015   LABRPR REACTIVE (A) 08/06/2015   RPRTITER 1:1 (H) 03/30/2018   RPRTITER 1:1 (H) 08/11/2017   RPRTITER 1:1 12/25/2015   RPRTITER 1:1 08/06/2015   RPRTITER 1:8 07/03/2014    STI Results GC CT  03/30/2018 Negative **POSITIVE**(A)  03/30/2018 Negative Negative  03/30/2018 Negative Negative  02/18/2018 Negative Negative  08/11/2017 Negative Negative  08/11/2017 **POSITIVE**(A) **POSITIVE**(A)  08/11/2017 Negative **POSITIVE**(A)  12/26/2015 Negative Negative   08/06/2015 Negative Negative  04/04/2015 Negative* Negative*  04/04/2015 Negative* Negative*  04/04/2015 Negative Negative  02/15/2014 NG: Negative CT: Negative  08/02/2013 This specimen does not meet the strict criteria set by the FDA. The result interpretation should be considered in conjunction with the patients`s clinical history. -  08/02/2013 NG: Negative* CT: Negative*  08/02/2013 *This specimen does not meet the strict criteria set by the FDA. The result interpretation should be considered in conjuction with the patient`s clinical history. -  08/02/2013 NG: Negative* CT: Negative*    Hepatitis B Lab Results  Component Value Date   HEPBSAB NON-REACTIVE 04/06/2018   HEPBSAG NON-REACTIVE 04/06/2018   Hepatitis C Lab Results  Component Value Date   HEPCAB NON-REACTIVE 04/06/2018   Hepatitis A No results found for: HAV Lipids: Lab Results  Component Value Date   CHOL 188 08/11/2017   TRIG 61 08/11/2017   HDL 54 08/11/2017   CHOLHDL 3.5 08/11/2017   VLDL 43 (H) 08/06/2015   LDLCALC 119 (H) 08/11/2017    Current HIV Regimen: Biktarvy and Prezcobix  Assessment: Jeff Ross is here today for a research appointment this morning. She originally had an appointment with Judeth CornfieldStephanie scheduled for this afternoon but she was hoping to be seen right away. Judeth CornfieldStephanie had no open availabilities in her schedule at the time so we were able to. She reports that she has only missed one dose of medication. Her last HIV RNA on 12/11 was 2,460. Patient complained of her arm being sore from having labs drawn, so will defer HIV RNA until next appointment. She does not report any other side effects/concerns with her HIV medications. She did express a desire for both pills and patches for her estrogen therapy.  Jeff Ross expressed a need to make an appointment with [name], so we scheduled her an appointment for immediately after her next research appointment on 1/27. Additionally, made a one month follow-up  appointment with Judeth CornfieldStephanie on 2/11.  Plan: -Continue Biktarvy and Prezcobix -Follow-up with Judeth CornfieldStephanie on 2/11 at 11am   [facility]in[facility] Alean Krom[facility] [employer] 05/02/2018, 11:52 AM

## 2018-05-03 LAB — COMPLETE METABOLIC PANEL WITH GFR
AG Ratio: 1.3 (calc) (ref 1.0–2.5)
ALKALINE PHOSPHATASE (APISO): 101 U/L (ref 40–115)
ALT: 8 U/L — ABNORMAL LOW (ref 9–46)
AST: 14 U/L (ref 10–40)
Albumin: 4.3 g/dL (ref 3.6–5.1)
BUN: 13 mg/dL (ref 7–25)
CO2: 25 mmol/L (ref 20–32)
Calcium: 9.3 mg/dL (ref 8.6–10.3)
Chloride: 106 mmol/L (ref 98–110)
Creat: 1.04 mg/dL (ref 0.60–1.35)
GFR, Est African American: 104 mL/min/{1.73_m2} (ref >=60)
GFR, Est Non African American: 90 mL/min/{1.73_m2} (ref >=60)
GLOBULIN: 3.3 g/dL (ref 1.9–3.7)
Glucose, Bld: 90 mg/dL (ref 65–99)
Potassium: 3.7 mmol/L (ref 3.5–5.3)
Sodium: 139 mmol/L (ref 135–146)
Total Bilirubin: 0.3 mg/dL (ref 0.2–1.2)
Total Protein: 7.6 g/dL (ref 6.1–8.1)

## 2018-05-03 LAB — LIPID PANEL
Cholesterol: 181 mg/dL (ref <200)
HDL: 53 mg/dL (ref >40)
LDL Cholesterol (Calc): 114 mg/dL (calc) — ABNORMAL HIGH
Non-HDL Cholesterol (Calc): 128 mg/dL (calc) (ref <130)
Total CHOL/HDL Ratio: 3.4 (calc) (ref <5.0)
Triglycerides: 59 mg/dL (ref <150)

## 2018-05-03 LAB — BILIRUBIN, DIRECT: Bilirubin, Direct: 0.1 mg/dL (ref 0.0–0.2)

## 2018-05-16 ENCOUNTER — Ambulatory Visit (INDEPENDENT_AMBULATORY_CARE_PROVIDER_SITE_OTHER): Payer: Self-pay | Admitting: Licensed Clinical Social Worker

## 2018-05-16 ENCOUNTER — Encounter (INDEPENDENT_AMBULATORY_CARE_PROVIDER_SITE_OTHER): Payer: Self-pay | Admitting: *Deleted

## 2018-05-16 VITALS — BP 127/78 | HR 89 | Temp 98.0°F | Wt 173.2 lb

## 2018-05-16 DIAGNOSIS — Z006 Encounter for examination for normal comparison and control in clinical research program: Secondary | ICD-10-CM

## 2018-05-16 DIAGNOSIS — F331 Major depressive disorder, recurrent, moderate: Secondary | ICD-10-CM

## 2018-05-16 NOTE — Research (Signed)
Jeff Ross was here for her week 2 visit for A5359. She says she has taken both meds every day, but she was 1 day short on the biktarvy. She thinks she may have taken a tablet out of an old bottle, so we counted all the meds she had with her. She also just received a new bottle of Prezcobix from the pharmacy. She denies any new problems or concerns. She was given her [employer] today for the economic incentives and will get $75 on that card for making the visit. She is due to return in 2 weeks for the next visit and is seeing Rene Kocher today.

## 2018-05-16 NOTE — Progress Notes (Signed)
Integrated Behavioral Health Follow Up Visit  MRN: [id number] Name: Jeff Ross  Number of Integrated Behavioral Health Clinician visits: 2/6 Session Start time: 11:30am  Session End time: 12:00pm Total time: 30 minutes  Type of Service: Integrated Behavioral Health- Individual/Family Interpretor:No. Interpretor Name and Language: n/a  SUBJECTIVE: Jeff Ross is a 38 y.o. transgender male accompanied by self Patient reports the following symptoms/concerns: anxiety, trouble sleeping, trouble concentrating, depressed mood, intrusive thoughts, angers easily, unstable relationships  OBJECTIVE: Mood: Depressed and Affect: Constricted Risk of harm to self or others: No plan to harm self or others  LIFE CONTEXT: Patient continues to be homeless, camping in the woods most nights unless someone gives her money for a hotel room. She reports that her mother has given her money a couple of times, but mom is not terribly supportive so she tries to keep this to a minimum due to the negativity attached. Patient has applied for housing in [facility] and for a couple of jobs, though she states what she wants to do is go to Paediatric nurse school so she can work for herself. She indicates that she is counting on her disability application being approved, although it was not last time.   GOALS ADDRESSED: Patient will: 1.  Reduce symptoms of: depression    INTERVENTIONS: Interventions utilized:  Motivational Interviewing and Supportive Counseling  ASSESSMENT: Patient currently experiencing anxiety, depressed mood, blunt affect, difficulty sleeping/insomnia, difficulty with concentration, intrusive worry thoughts, irritability and distrust/stress in personal relationships. She reports historical diagnoses of Major Depressive Disorder, PTSD, and Borderline Personality Disorder. Counselor educated patient on mood disorder, experiential response, and personality disorder. Counselor guided patient to identify  what she is motivated to focus on and what changes she wants to see. Patient struggled to identify mental health/emotional goals, focusing more on housing and financial goals. Counselor pointed out that some of the stressors patient reports are interpersonal and internal in nature, and whether or not she meets logistical needs these more internalized needs also need to be addressed. Patient stated that she would like to see a reduction in her anxiety and hypervigilance. Counselor commended her for this goal, and pointed out that because she is in a physically unstable/unsafe situation (homeless and sleeping in the woods) some of the anxiety may be needed for survival. Counselor and patient explored the types of anxiety patient experiences. Patient stated that she wants to "not be so nervous around people and ruin relationships" but she also does not want to build relationships with others.   Patient may benefit from counseling sessions twice per month.  PLAN: 1. Follow up with behavioral health clinician on : 05/31/18   Angus Palms, LCSW

## 2018-05-18 ENCOUNTER — Encounter: Payer: Self-pay | Admitting: *Deleted

## 2018-05-18 LAB — HIV-1 RNA QUANT-NO REFLEX-BLD
CD4 Count: 777
CD4 T CELL HELPER: 40.9
CD8 % Suppressor T Cell: 38.2
CD8 T Cell Abs: 726
HIV-1 RNA Viral Load: <40

## 2018-05-19 LAB — CBC
CD4%: 40.9 % (ref 30.8–58.5)
CD4: 777 /uL (ref 359–1519)
CD8 % Suppressor T Cell: 38.2 % — ABNORMAL HIGH (ref 12–35.5)
CD8 T Cell Abs: 726 /uL (ref 109–897)

## 2018-05-19 LAB — HIV RNA, QUANTITATIVE, PCR: HIV: NOT DETECTED copies/mL (ref ?–40)

## 2018-05-22 ENCOUNTER — Other Ambulatory Visit: Payer: Self-pay | Admitting: Infectious Diseases

## 2018-05-22 DIAGNOSIS — B20 Human immunodeficiency virus [HIV] disease: Secondary | ICD-10-CM

## 2018-05-23 ENCOUNTER — Other Ambulatory Visit: Payer: Self-pay

## 2018-05-23 DIAGNOSIS — B20 Human immunodeficiency virus [HIV] disease: Secondary | ICD-10-CM

## 2018-05-23 MED ORDER — BICTEGRAVIR-EMTRICITAB-TENOFOV 50-200-25 MG PO TABS: 1.0000 | ORAL_TABLET | Freq: Every day | ORAL | 3 refills | Status: DC

## 2018-05-23 MED ORDER — DARUNAVIR-COBICISTAT 800-150 MG PO TABS: 1.0000 | ORAL_TABLET | Freq: Every day | ORAL | 3 refills | Status: DC

## 2018-05-31 ENCOUNTER — Encounter: Payer: Self-pay | Admitting: Infectious Diseases

## 2018-05-31 ENCOUNTER — Ambulatory Visit (INDEPENDENT_AMBULATORY_CARE_PROVIDER_SITE_OTHER): Payer: Self-pay | Admitting: Licensed Clinical Social Worker

## 2018-05-31 ENCOUNTER — Encounter (INDEPENDENT_AMBULATORY_CARE_PROVIDER_SITE_OTHER): Payer: Self-pay | Admitting: *Deleted

## 2018-05-31 ENCOUNTER — Ambulatory Visit (INDEPENDENT_AMBULATORY_CARE_PROVIDER_SITE_OTHER): Payer: Self-pay | Admitting: Infectious Diseases

## 2018-05-31 VITALS — BP 122/72 | HR 76 | Temp 98.1°F | Wt 172.0 lb

## 2018-05-31 VITALS — BP 121/77 | HR 74 | Temp 98.4°F | Wt 172.0 lb

## 2018-05-31 DIAGNOSIS — B20 Human immunodeficiency virus [HIV] disease: Secondary | ICD-10-CM

## 2018-05-31 DIAGNOSIS — F640 Transsexualism: Secondary | ICD-10-CM

## 2018-05-31 DIAGNOSIS — F331 Major depressive disorder, recurrent, moderate: Secondary | ICD-10-CM

## 2018-05-31 DIAGNOSIS — Z789 Other specified health status: Secondary | ICD-10-CM

## 2018-05-31 DIAGNOSIS — Z006 Encounter for examination for normal comparison and control in clinical research program: Secondary | ICD-10-CM

## 2018-05-31 MED ORDER — ESTRADIOL 0.1 MG/24HR TD PTWK: 0.2000 mg | MEDICATED_PATCH | TRANSDERMAL | 11 refills | Status: DC

## 2018-05-31 MED ORDER — SPIRONOLACTONE 100 MG PO TABS: 100.0000 mg | ORAL_TABLET | Freq: Every day | ORAL | 2 refills | Status: DC

## 2018-05-31 NOTE — BH Specialist Note (Signed)
Integrated Behavioral Health Follow Up Visit  MRN: [id number] Name: Jeff Ross  Number of Integrated Behavioral Health Clinician visits: 3/6 Session Start time: 11:20am  Session End time: 11:40am Total time: 20 minutes  Type of Service: Integrated Behavioral Health- Individual/Family Interpretor:No. Interpretor Name and Language: n/a   SUBJECTIVE: Jeff Ross is a 38 y.o. male accompanied by self Patient reports the following symptoms/concerns: anxiety being around other people, easily irritated, mood swings,   OBJECTIVE: Mood: Anxious and Affect: Constricted Risk of harm to self or others: No plan to harm self or others  LIFE CONTEXT: Patient reports that she and another girl have gotten a room to share since it's raining so much, but she is not sure how she'll pay her 1/2 of the room rental. She indicates that she has put in job applications at [employer] and with the [employer], and intends to contact them today to follow up.   GOALS ADDRESSED: Patient will: 1.  Reduce symptoms of: anxiety and depression    INTERVENTIONS: Interventions utilized:  Brief CBT and Supportive Counseling  ASSESSMENT: Patient currently experiencing irritability, difficulty concentrating, anxiety when around people, quickly changing moods. Counselor processed with client her experience in rooming with another girl. Patient states that roommate is not a friend, and that she does not really trust her. She suspects that roommate is going through her stuff when she's not around. Counselor guided patient to explore her reasons for believing this. Patient states that it is a combination of intuition, and the other girl always being "up and moving stuff" when patient is in the room. She also stated that roommate annoys her by trying to talk with her while patient is doing something else or has asked for quiet. Counselor challenged patient to consider whether the girl thinks of patient as a friend, and is  just trying to engage in that context. Counselor and patient explored whether patient's beliefs about roommate may be rooted in her anxiety, rather than in intuition. Counselor attempted to guide patient in functional analysis of the situation. Patient is dismissive of this, and becomes fixated on finding a way to get money to pay for the room. She states that mom is the only one who can help her with the money, but it always comes with mom giving advice and admonishing patient. Counselor guided patient to share her thoughts and feelings about this. Counselor and patient brainstormed ways that patient can prepare for the conversation with her mom, and ways that she can cope with mom's response.    Patient may benefit from ongoing CBT sessions 2-3 times per month.  PLAN: 1. Patient will call to set follow-up appointment   Angus Palms, LCSW
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
## 2018-05-31 NOTE — Research (Signed)
Jeff Ross was here for her week 4 visit for A5359. Her adherence was down this time by pill counts to 73%. She said she may have fallen asleep during the day and forgot to take them. We discussed different strategies to help get her on schedule including using a calendar to check off when she takes them. She keeps them in her book bag with her most of the time. We also discussed the economic incentives and how they worked. Her viral load needs to be under 200 for her to get any this time.  She will be returning in 4 weeks for the next study visit. She is also seeing the NP and counselor today.

## 2018-05-31 NOTE — Patient Instructions (Addendum)
Continue your patches - I will send in refills for this if you would like to continue but give us a call if you decide to go back to estrace pills.  Continue your spironolatone in the morning (makes you make more urine)  Continue your Biktarvy and Prezcobix every day --> We can drop the Prezcobix off based on your last blood work. We can talk about this again at upcoming visit.   Please continue to work with [name] and [name] team.   Jeff CornfieldStephanie would like to see you back in 3 months

## 2018-06-02 LAB — HIV-1 RNA QUANT-NO REFLEX-BLD
HIV 1 RNA Quant: <20 copies/mL
HIV-1 RNA Quant, Log: <1.3 Log copies/mL

## 2018-06-02 LAB — CBC WITH DIFFERENTIAL/PLATELET
Absolute Monocytes: 403 cells/uL (ref 200–950)
Basophils Absolute: 49 cells/uL (ref 0–200)
Basophils Relative: 0.8 %
EOS ABS: 323 {cells}/uL (ref 15–500)
Eosinophils Relative: 5.3 %
HCT: 39.4 % (ref 38.5–50.0)
Hemoglobin: 13.4 g/dL (ref 13.2–17.1)
Lymphs Abs: 2056 cells/uL (ref 850–3900)
MCH: 33.8 pg — AB (ref 27.0–33.0)
MCHC: 34 g/dL (ref 32.0–36.0)
MCV: 99.5 fL (ref 80.0–100.0)
MPV: 9.4 fL (ref 7.5–12.5)
Monocytes Relative: 6.6 %
Neutro Abs: 3270 cells/uL (ref 1500–7800)
Neutrophils Relative %: 53.6 %
Platelets: 309 10*3/uL (ref 140–400)
RBC: 3.96 10*6/uL — ABNORMAL LOW (ref 4.20–5.80)
RDW: 11.5 % (ref 11.0–15.0)
Total Lymphocyte: 33.7 %
WBC: 6.1 10*3/uL (ref 3.8–10.8)

## 2018-06-02 LAB — COMPLETE METABOLIC PANEL WITH GFR
AG Ratio: 1.4 (calc) (ref 1.0–2.5)
ALT: 9 U/L (ref 9–46)
AST: 14 U/L (ref 10–40)
Albumin: 4.4 g/dL (ref 3.6–5.1)
Alkaline phosphatase (APISO): 107 U/L (ref 36–130)
BILIRUBIN TOTAL: 0.4 mg/dL (ref 0.2–1.2)
BUN: 18 mg/dL (ref 7–25)
CO2: 28 mmol/L (ref 20–32)
Calcium: 9.2 mg/dL (ref 8.6–10.3)
Chloride: 103 mmol/L (ref 98–110)
Creat: 0.97 mg/dL (ref 0.60–1.35)
GFR, Est African American: 113 mL/min/{1.73_m2} (ref >=60)
GFR, Est Non African American: 98 mL/min/{1.73_m2} (ref >=60)
Globulin: 3.2 g/dL (calc) (ref 1.9–3.7)
Glucose, Bld: 90 mg/dL (ref 65–99)
Potassium: 3.9 mmol/L (ref 3.5–5.3)
Sodium: 137 mmol/L (ref 135–146)
Total Protein: 7.6 g/dL (ref 6.1–8.1)

## 2018-06-02 LAB — BILIRUBIN, DIRECT: Bilirubin, Direct: 0.1 mg/dL (ref 0.0–0.2)

## 2018-06-27 ENCOUNTER — Encounter: Payer: Self-pay | Admitting: *Deleted

## 2018-06-28 ENCOUNTER — Encounter (INDEPENDENT_AMBULATORY_CARE_PROVIDER_SITE_OTHER): Payer: Self-pay | Admitting: *Deleted

## 2018-06-28 VITALS — BP 136/76 | HR 88 | Temp 97.5°F | Wt 170.5 lb

## 2018-06-28 DIAGNOSIS — Z006 Encounter for examination for normal comparison and control in clinical research program: Secondary | ICD-10-CM

## 2018-06-28 NOTE — Research (Signed)
Jeff Ross came today for her week 8 study visit for A5359. She said she had been feeling bad about 2 weeks ago for a few days with fever, and fatigue and had stayed in the bed and took some cold/flu medication. She denied any muscle aches or resp. Sx. Her pill counts were difficult to access because she had several different bottles of her medications with varying amounts of pills. So we put all of them in one bottle each. I told her to not pick up any more meds until she is almost out of the ones she has. That way we could get a better idea of how well she is taking them. She says she only remembers missing 2 days, but doesn't always take them at the same time of day. She will be returning in 4 weeks for the next study visit.

## 2018-07-01 LAB — HIV-1 RNA QUANT-NO REFLEX-BLD
HIV 1 RNA QUANT: DETECTED {copies}/mL — AB
HIV-1 RNA Quant, Log: <1.3 Log copies/mL — AB

## 2018-07-27 ENCOUNTER — Encounter (INDEPENDENT_AMBULATORY_CARE_PROVIDER_SITE_OTHER): Payer: Self-pay

## 2018-07-27 ENCOUNTER — Other Ambulatory Visit: Payer: Self-pay

## 2018-07-27 VITALS — BP 115/73 | HR 81 | Temp 98.1°F | Wt 177.0 lb

## 2018-07-27 DIAGNOSIS — Z006 Encounter for examination for normal comparison and control in clinical research program: Secondary | ICD-10-CM

## 2018-07-27 DIAGNOSIS — B20 Human immunodeficiency virus [HIV] disease: Secondary | ICD-10-CM

## 2018-07-27 DIAGNOSIS — A64 Unspecified sexually transmitted disease: Secondary | ICD-10-CM

## 2018-07-27 MED ORDER — CEFTRIAXONE SODIUM 250 MG IJ SOLR
250.0000 mg | Freq: Once | INTRAMUSCULAR | Status: AC
  Administered 2018-07-27: 16:00:00 250 mg via INTRAMUSCULAR

## 2018-07-27 MED ORDER — AZITHROMYCIN 250 MG PO TABS
1000.0000 mg | ORAL_TABLET | Freq: Once | ORAL | Status: AC
  Administered 2018-07-27: 1000 mg via ORAL

## 2018-07-27 NOTE — Research (Signed)
Participant here for study [phone] for week 12. She says she is doing well. She is currently staying in a hotel her mom is helping her pay for. She says she takes her biktarvy and prezcobix almost daily though not always at the same time of day, just when she thinks about it. She thinks over the last 30 days she may have only missed 2 doses. She complained of rectal itching and moist discharge as well as itching in the groin and dry throat with excess phlegm that is consistently present despite how much fluids she drinks. She has had receptive anal sex as well as given and received oral sex. She was vague about her response to any insertive sex. We performed an RPR, as well as urine g/c and rectal g/c. I consulted with Rexene Alberts, NP and treated with 250mg  IM rocephin and 1000mg  azithromycin given once in office. Allergies received prior. She was instructed to call us should her symptoms persist.

## 2018-07-28 LAB — C. TRACHOMATIS/N. GONORRHOEAE RNA
C. trachomatis RNA, TMA: NOT DETECTED
N. gonorrhoeae RNA, TMA: NOT DETECTED

## 2018-07-29 LAB — CT/NG RNA, TMA RECTAL
Chlamydia Trachomatis RNA: NOT DETECTED
Neisseria Gonorrhoeae RNA: DETECTED — AB

## 2018-08-02 LAB — RPR: RPR Ser Ql: NONREACTIVE

## 2018-08-02 LAB — COMPREHENSIVE METABOLIC PANEL
AG Ratio: 1.4 (calc) (ref 1.0–2.5)
ALT: 10 U/L (ref 9–46)
AST: 12 U/L (ref 10–40)
Albumin: 4.2 g/dL (ref 3.6–5.1)
Alkaline phosphatase (APISO): 86 U/L (ref 36–130)
BUN: 16 mg/dL (ref 7–25)
CO2: 27 mmol/L (ref 20–32)
Calcium: 9.2 mg/dL (ref 8.6–10.3)
Chloride: 104 mmol/L (ref 98–110)
Creat: 1 mg/dL (ref 0.60–1.35)
Globulin: 3.1 g/dL (calc) (ref 1.9–3.7)
Glucose, Bld: 53 mg/dL — ABNORMAL LOW (ref 65–99)
Potassium: 4.4 mmol/L (ref 3.5–5.3)
Sodium: 138 mmol/L (ref 135–146)
Total Bilirubin: 0.4 mg/dL (ref 0.2–1.2)
Total Protein: 7.3 g/dL (ref 6.1–8.1)

## 2018-08-02 LAB — BILIRUBIN, DIRECT: Bilirubin, Direct: 0.1 mg/dL (ref 0.0–0.2)

## 2018-08-02 LAB — HIV-1 RNA QUANT-NO REFLEX-BLD
HIV 1 RNA Quant: 34 copies/mL — ABNORMAL HIGH
HIV-1 RNA Quant, Log: 1.53 Log copies/mL — ABNORMAL HIGH

## 2018-08-23 ENCOUNTER — Encounter (INDEPENDENT_AMBULATORY_CARE_PROVIDER_SITE_OTHER): Payer: Self-pay | Admitting: *Deleted

## 2018-08-23 ENCOUNTER — Other Ambulatory Visit: Payer: Self-pay

## 2018-08-23 VITALS — BP 141/78 | HR 86 | Temp 98.5°F | Wt 179.5 lb

## 2018-08-23 DIAGNOSIS — Z006 Encounter for examination for normal comparison and control in clinical research program: Secondary | ICD-10-CM

## 2018-08-23 NOTE — Research (Signed)
Jeff Ross was here for week 16 for .[id number]. She denies any new problems or concerns. Her adherence was only 63% and she thought she had done much better with only missing 3-4 tablets this month.  She said she had lost her calendar which she used to mark off each day she took her pills so I gave her a new one to work with. She had a virtual visit with [facility] and they have prescribed a medication for her. She has not picked it up yet and doesn't know the name of it. She is supposed to go today to get that and her ARVs from the pharmacy and she is to let us know what the medication is. She will be returning in 4 weeks for the next appt.

## 2018-08-27 LAB — HIV-1 RNA QUANT-NO REFLEX-BLD
HIV 1 RNA Quant: <20 copies/mL
HIV-1 RNA Quant, Log: <1.3 Log copies/mL

## 2018-09-20 ENCOUNTER — Other Ambulatory Visit: Payer: Self-pay

## 2018-09-20 ENCOUNTER — Encounter (INDEPENDENT_AMBULATORY_CARE_PROVIDER_SITE_OTHER): Payer: Self-pay | Admitting: *Deleted

## 2018-09-20 VITALS — BP 110/63 | HR 72 | Temp 98.3°F | Wt 182.0 lb

## 2018-09-20 DIAGNOSIS — Z006 Encounter for examination for normal comparison and control in clinical research program: Secondary | ICD-10-CM
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
## 2018-09-27 LAB — HIV-1 RNA QUANT-NO REFLEX-BLD
HIV 1 RNA Quant: <20 copies/mL
HIV-1 RNA Quant, Log: <1.3 Log copies/mL

## 2018-10-15 ENCOUNTER — Other Ambulatory Visit: Payer: Self-pay | Admitting: Internal Medicine

## 2018-10-15 DIAGNOSIS — B20 Human immunodeficiency virus [HIV] disease: Secondary | ICD-10-CM

## 2018-10-18 ENCOUNTER — Encounter (INDEPENDENT_AMBULATORY_CARE_PROVIDER_SITE_OTHER): Payer: Self-pay | Admitting: *Deleted

## 2018-10-18 ENCOUNTER — Other Ambulatory Visit: Payer: Self-pay

## 2018-10-18 VITALS — BP 108/64 | HR 69 | Temp 97.2°F | Wt 180.8 lb

## 2018-10-18 DIAGNOSIS — Z006 Encounter for examination for normal comparison and control in clinical research program: Secondary | ICD-10-CM
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
## 2018-10-27 LAB — HIV-1 RNA QUANT-NO REFLEX-BLD
HIV 1 RNA Quant: <20 copies/mL
HIV-1 RNA Quant, Log: <1.3 Log copies/mL

## 2018-11-17 ENCOUNTER — Encounter: Payer: Self-pay | Admitting: *Deleted

## 2018-11-21 ENCOUNTER — Encounter: Payer: Self-pay | Admitting: *Deleted

## 2018-11-22 ENCOUNTER — Other Ambulatory Visit: Payer: Self-pay

## 2018-11-22 ENCOUNTER — Encounter (INDEPENDENT_AMBULATORY_CARE_PROVIDER_SITE_OTHER): Payer: Self-pay | Admitting: *Deleted

## 2018-11-22 VITALS — BP 119/64 | HR 76 | Temp 97.6°F

## 2018-11-22 DIAGNOSIS — Z006 Encounter for examination for normal comparison and control in clinical research program: Secondary | ICD-10-CM
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
## 2018-11-29 LAB — HIV-1 RNA QUANT-NO REFLEX-BLD
HIV 1 RNA Quant: <20 copies/mL
HIV-1 RNA Quant, Log: <1.3 Log copies/mL

## 2018-12-11 ENCOUNTER — Other Ambulatory Visit: Payer: Self-pay | Admitting: Infectious Disease

## 2018-12-11 DIAGNOSIS — B20 Human immunodeficiency virus [HIV] disease: Secondary | ICD-10-CM

## 2018-12-12 ENCOUNTER — Other Ambulatory Visit: Payer: Self-pay

## 2018-12-12 DIAGNOSIS — B20 Human immunodeficiency virus [HIV] disease: Secondary | ICD-10-CM

## 2018-12-13 ENCOUNTER — Encounter: Payer: Self-pay | Admitting: *Deleted

## 2018-12-15 ENCOUNTER — Encounter: Payer: Self-pay | Admitting: *Deleted

## 2018-12-19 ENCOUNTER — Encounter: Payer: Self-pay | Admitting: *Deleted

## 2018-12-20 ENCOUNTER — Encounter: Payer: Self-pay | Admitting: *Deleted

## 2018-12-20 ENCOUNTER — Other Ambulatory Visit: Payer: Self-pay

## 2018-12-20 VITALS — BP 128/77 | HR 73 | Temp 98.3°F | Wt 180.0 lb

## 2018-12-20 DIAGNOSIS — Z113 Encounter for screening for infections with a predominantly sexual mode of transmission: Secondary | ICD-10-CM

## 2018-12-20 DIAGNOSIS — Z006 Encounter for examination for normal comparison and control in clinical research program: Secondary | ICD-10-CM

## 2018-12-20 NOTE — Addendum Note (Signed)
Addended by: Dolan Amen D on: 12/20/2018 04:46 PM   Modules accepted: Orders

## 2018-12-22 ENCOUNTER — Encounter: Payer: Self-pay | Admitting: Internal Medicine

## 2018-12-22 LAB — URINE CYTOLOGY ANCILLARY ONLY
Chlamydia: NEGATIVE
Neisseria Gonorrhea: NEGATIVE

## 2018-12-22 LAB — CYTOLOGY, (ORAL, ANAL, URETHRAL) ANCILLARY ONLY
Chlamydia: NEGATIVE
Neisseria Gonorrhea: POSITIVE — AB

## 2018-12-23 ENCOUNTER — Telehealth: Payer: Self-pay

## 2018-12-23 NOTE — Telephone Encounter (Signed)
Attempted to call patient, no answer, left HIPPA compliant voicemail to return call. Jeff Ross

## 2018-12-23 NOTE — Telephone Encounter (Signed)
-----   Message from Truman Hayward, MD sent at 12/23/2018 10:14 AM EDT ----- Jeff Ross needs 250mg  of ceftriaxone and 1 gram of azithromycin to treat her GC. Partners need to be tested and treated and referred fro PrEP if HIV -

## 2018-12-24 LAB — COMPREHENSIVE METABOLIC PANEL
AG Ratio: 1.3 (calc) (ref 1.0–2.5)
ALT: 9 U/L (ref 9–46)
AST: 13 U/L (ref 10–40)
Albumin: 4.3 g/dL (ref 3.6–5.1)
Alkaline phosphatase (APISO): 98 U/L (ref 36–130)
BUN: 19 mg/dL (ref 7–25)
CO2: 24 mmol/L (ref 20–32)
Calcium: 9.3 mg/dL (ref 8.6–10.3)
Chloride: 104 mmol/L (ref 98–110)
Creat: 1.07 mg/dL (ref 0.60–1.35)
Globulin: 3.2 g/dL (calc) (ref 1.9–3.7)
Glucose, Bld: 94 mg/dL (ref 65–99)
Potassium: 4 mmol/L (ref 3.5–5.3)
Sodium: 137 mmol/L (ref 135–146)
Total Bilirubin: 0.3 mg/dL (ref 0.2–1.2)
Total Protein: 7.5 g/dL (ref 6.1–8.1)

## 2018-12-24 LAB — CBC WITH DIFFERENTIAL/PLATELET
Absolute Monocytes: 523 cells/uL (ref 200–950)
Basophils Absolute: 91 cells/uL (ref 0–200)
Basophils Relative: 1.1 %
Eosinophils Absolute: 490 cells/uL (ref 15–500)
Eosinophils Relative: 5.9 %
HCT: 40.5 % (ref 38.5–50.0)
Hemoglobin: 13.5 g/dL (ref 13.2–17.1)
Lymphs Abs: 2249 cells/uL (ref 850–3900)
MCH: 34 pg — ABNORMAL HIGH (ref 27.0–33.0)
MCHC: 33.3 g/dL (ref 32.0–36.0)
MCV: 102 fL — ABNORMAL HIGH (ref 80.0–100.0)
MPV: 9.5 fL (ref 7.5–12.5)
Monocytes Relative: 6.3 %
Neutro Abs: 4947 cells/uL (ref 1500–7800)
Neutrophils Relative %: 59.6 %
Platelets: 323 10*3/uL (ref 140–400)
RBC: 3.97 10*6/uL — ABNORMAL LOW (ref 4.20–5.80)
RDW: 11.2 % (ref 11.0–15.0)
Total Lymphocyte: 27.1 %
WBC: 8.3 10*3/uL (ref 3.8–10.8)

## 2018-12-24 LAB — RPR: RPR Ser Ql: REACTIVE — AB

## 2018-12-24 LAB — RPR TITER: RPR Titer: 1:1 {titer} — ABNORMAL HIGH

## 2018-12-24 LAB — HIV-1 RNA QUANT-NO REFLEX-BLD
HIV 1 RNA Quant: <20 copies/mL
HIV-1 RNA Quant, Log: <1.3 Log copies/mL

## 2018-12-24 LAB — FLUORESCENT TREPONEMAL AB(FTA)-IGG-BLD: Fluorescent Treponemal ABS: REACTIVE — AB

## 2018-12-24 LAB — BILIRUBIN, DIRECT: Bilirubin, Direct: 0.1 mg/dL (ref 0.0–0.2)

## 2018-12-27 ENCOUNTER — Ambulatory Visit: Payer: Self-pay

## 2018-12-27 NOTE — Telephone Encounter (Signed)
exactly

## 2018-12-27 NOTE — Telephone Encounter (Signed)
Patient returned call and scheduled for nurse visit later this afternoon for [facility] treatment. Pre Terri Piedra, NP patient needs 250 mg of ceftriaxone and 1 gram azithromycin Eugenia Mcalpine

## 2019-01-03 ENCOUNTER — Telehealth: Payer: Self-pay

## 2019-01-03 NOTE — Telephone Encounter (Signed)
Outgoing call made to patient for follow up on GC treatment. Patient states he has not received any treatment for GC with the local [facility] and patient plans to call [facility] this week for treatment. Patient was unable to make appointment over the phone at this time due patient wanting to confirm transportation would be available.  Jeff Ross

## 2019-01-24 ENCOUNTER — Ambulatory Visit (INDEPENDENT_AMBULATORY_CARE_PROVIDER_SITE_OTHER): Payer: Self-pay

## 2019-01-24 ENCOUNTER — Other Ambulatory Visit: Payer: Self-pay

## 2019-01-24 VITALS — BP 147/79 | HR 78 | Temp 97.5°F

## 2019-01-24 DIAGNOSIS — A64 Unspecified sexually transmitted disease: Secondary | ICD-10-CM

## 2019-01-24 DIAGNOSIS — A749 Chlamydial infection, unspecified: Secondary | ICD-10-CM

## 2019-01-24 DIAGNOSIS — A549 Gonococcal infection, unspecified: Secondary | ICD-10-CM

## 2019-01-24 DIAGNOSIS — Z006 Encounter for examination for normal comparison and control in clinical research program: Secondary | ICD-10-CM

## 2019-01-24 LAB — HIV-1 RNA QUANT-NO REFLEX-BLD
CD4 % Helper T Cell: 44
CD4 Count: 1100
CD8 % Suppressor T Cell: 38.5
CD8 T Cell Abs: 963
HIV-1 RNA Viral Load: <40

## 2019-01-24 MED ORDER — CEFTRIAXONE SODIUM 250 MG IJ SOLR
250.0000 mg | Freq: Once | INTRAMUSCULAR | Status: AC
  Administered 2019-01-24: 11:00:00 250 mg via INTRAMUSCULAR

## 2019-01-24 MED ORDER — AZITHROMYCIN 250 MG PO TABS
1000.0000 mg | ORAL_TABLET | Freq: Once | ORAL | Status: AC
  Administered 2019-01-24: 1000 mg via ORAL

## 2019-01-24 NOTE — Progress Notes (Signed)
Patient came into office today for research appointment. Mickel Baas, RN from research informed CMA that patient was never treated today for STD. Would like to know if she would be able to be treated today while at appointment.  Was able to treat patient today with orders provided in last phone note from [facility] [place]. Patient tolerated both Rocephin and Azithromycin well. [place]

## 2019-01-25 LAB — LIPID PANEL
Cholesterol: 188 mg/dL (ref <200)
HDL: 47 mg/dL (ref >=40)
LDL Cholesterol (Calc): 121 mg/dL (calc) — ABNORMAL HIGH
Non-HDL Cholesterol (Calc): 141 mg/dL (calc) — ABNORMAL HIGH (ref <130)
Total CHOL/HDL Ratio: 4 (calc) (ref <5.0)
Triglycerides: 98 mg/dL (ref <150)
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
## 2019-02-16 ENCOUNTER — Encounter: Payer: Self-pay | Admitting: *Deleted

## 2019-02-22 ENCOUNTER — Encounter: Payer: Self-pay | Admitting: *Deleted

## 2019-03-06 ENCOUNTER — Other Ambulatory Visit: Payer: Self-pay | Admitting: Infectious Diseases

## 2019-03-06 DIAGNOSIS — B20 Human immunodeficiency virus [HIV] disease: Secondary | ICD-10-CM

## 2019-03-21 ENCOUNTER — Encounter: Payer: Self-pay | Admitting: *Deleted

## 2019-03-22 ENCOUNTER — Encounter: Payer: Self-pay | Admitting: *Deleted

## 2019-04-02 ENCOUNTER — Other Ambulatory Visit: Payer: Self-pay | Admitting: Infectious Diseases

## 2019-04-02 DIAGNOSIS — B20 Human immunodeficiency virus [HIV] disease: Secondary | ICD-10-CM

## 2019-04-03 ENCOUNTER — Telehealth: Payer: Self-pay | Admitting: *Deleted

## 2019-04-03 NOTE — Telephone Encounter (Signed)
RN received refill request for biktarvy alone (supposed to be on biktarvy + prezcobix, although [facility] [place] was considering dropping prezcobix per office note).  Patient last seen 05/2018, needs follow up. RN scheduled patient for labs 12/29, follow up 1/12 with [facility]. Unsure if ok to send 30 day fill, as patient filled biktarvy 11/16, but was only given prescription for 4 month supply at February visit.  Research lab 01/2019 showed undetectable virus.  Please advise if ok to send biktarvy alone or with prezcobix, of if should wait for visit 05/01/18. Landis Gandy, RN

## 2019-04-04 ENCOUNTER — Other Ambulatory Visit: Payer: Self-pay | Admitting: *Deleted

## 2019-04-04 DIAGNOSIS — B20 Human immunodeficiency virus [HIV] disease: Secondary | ICD-10-CM

## 2019-04-04 MED ORDER — BIKTARVY 50-200-25 MG PO TABS: 1.0000 | ORAL_TABLET | Freq: Every day | ORAL | 0 refills | Status: DC

## 2019-04-04 NOTE — Telephone Encounter (Signed)
Sent in 30 day supply of biktarvy, discontinued prezcobix. Thanks!

## 2019-04-04 NOTE — Telephone Encounter (Signed)
Per chart review she does not have any resistance that I would see a need for adding Prezcobix and would be okay with just Biktarvy. Thanks!

## 2019-04-18 ENCOUNTER — Other Ambulatory Visit: Payer: Self-pay

## 2019-04-18 ENCOUNTER — Other Ambulatory Visit: Payer: Self-pay | Admitting: *Deleted

## 2019-04-18 DIAGNOSIS — B20 Human immunodeficiency virus [HIV] disease: Secondary | ICD-10-CM

## 2019-04-18 DIAGNOSIS — Z113 Encounter for screening for infections with a predominantly sexual mode of transmission: Secondary | ICD-10-CM

## 2019-04-30 ENCOUNTER — Other Ambulatory Visit: Payer: Self-pay | Admitting: Infectious Diseases

## 2019-04-30 DIAGNOSIS — B20 Human immunodeficiency virus [HIV] disease: Secondary | ICD-10-CM

## 2019-05-01 ENCOUNTER — Telehealth: Payer: Self-pay

## 2019-05-01 ENCOUNTER — Other Ambulatory Visit: Payer: Self-pay | Admitting: Family

## 2019-05-01 DIAGNOSIS — B20 Human immunodeficiency virus [HIV] disease: Secondary | ICD-10-CM

## 2019-05-01 NOTE — Telephone Encounter (Signed)
Pending. Patient has appointment tomorrow and will be filled at that time.

## 2019-05-01 NOTE — Telephone Encounter (Signed)
Appointment tomorrow

## 2019-05-01 NOTE — Telephone Encounter (Signed)
COVID-19 Pre-Screening Questions:05/01/2019  Do you currently have a fever (>100 F), chills or unexplained body aches?NO  Are you currently experiencing new cough, shortness of breath, sore throat, runny nose? NO  .  Have you recently travelled outside the state of [place] in the last 14 days? NO .  Have you been in contact with someone that is currently pending confirmation of Covid19 testing or has been confirmed to have the Covid19 virus?  NO   **If the patient answers NO to ALL questions -  advise the patient to please call the clinic before coming to the office should any symptoms develop.     

## 2019-05-02 ENCOUNTER — Ambulatory Visit: Payer: Self-pay

## 2019-05-02 ENCOUNTER — Ambulatory Visit: Payer: Self-pay | Admitting: Infectious Diseases

## 2019-05-03 ENCOUNTER — Encounter (HOSPITAL_COMMUNITY): Payer: Self-pay | Admitting: Emergency Medicine

## 2019-05-03 ENCOUNTER — Emergency Department (HOSPITAL_COMMUNITY): Payer: Self-pay

## 2019-05-03 ENCOUNTER — Other Ambulatory Visit: Payer: Self-pay

## 2019-05-03 ENCOUNTER — Emergency Department (HOSPITAL_COMMUNITY)
Admission: EM | Admit: 2019-05-03 | Discharge: 2019-05-03 | Disposition: A | Discharge status: Home / Self Care | Payer: Self-pay | Attending: Emergency Medicine | Admitting: Emergency Medicine

## 2019-05-03 DIAGNOSIS — L0201 Cutaneous abscess of face: Secondary | ICD-10-CM | POA: Insufficient documentation

## 2019-05-03 DIAGNOSIS — F17210 Nicotine dependence, cigarettes, uncomplicated: Secondary | ICD-10-CM | POA: Insufficient documentation

## 2019-05-03 DIAGNOSIS — Z79899 Other long term (current) drug therapy: Secondary | ICD-10-CM | POA: Insufficient documentation

## 2019-05-03 DIAGNOSIS — B20 Human immunodeficiency virus [HIV] disease: Secondary | ICD-10-CM | POA: Insufficient documentation

## 2019-05-03 LAB — CBC WITH DIFFERENTIAL/PLATELET
Abs Immature Granulocytes: 0.08 10*3/uL — ABNORMAL HIGH (ref 0.00–0.07)
Basophils Absolute: 0.1 10*3/uL (ref 0.0–0.1)
Basophils Relative: 1 %
Eosinophils Absolute: 0.6 10*3/uL — ABNORMAL HIGH (ref 0.0–0.5)
Eosinophils Relative: 4 %
HCT: 41.5 % (ref 39.0–52.0)
Hemoglobin: 13.5 g/dL (ref 13.0–17.0)
Immature Granulocytes: 1 %
Lymphocytes Relative: 14 %
Lymphs Abs: 2.1 10*3/uL (ref 0.7–4.0)
MCH: 33.9 pg (ref 26.0–34.0)
MCHC: 32.5 g/dL (ref 30.0–36.0)
MCV: 104.3 fL — ABNORMAL HIGH (ref 80.0–100.0)
Monocytes Absolute: 1.3 10*3/uL — ABNORMAL HIGH (ref 0.1–1.0)
Monocytes Relative: 9 %
Neutro Abs: 10.7 10*3/uL — ABNORMAL HIGH (ref 1.7–7.7)
Neutrophils Relative %: 71 %
Platelets: 357 10*3/uL (ref 150–400)
RBC: 3.98 MIL/uL — ABNORMAL LOW (ref 4.22–5.81)
RDW: 11.2 % — ABNORMAL LOW (ref 11.5–15.5)
WBC: 15 10*3/uL — ABNORMAL HIGH (ref 4.0–10.5)
nRBC: 0 % (ref 0.0–0.2)

## 2019-05-03 LAB — BASIC METABOLIC PANEL
Anion gap: 9 (ref 5–15)
BUN: 14 mg/dL (ref 6–20)
CO2: 22 mmol/L (ref 22–32)
Calcium: 9 mg/dL (ref 8.9–10.3)
Chloride: 102 mmol/L (ref 98–111)
Creatinine, Ser: 0.87 mg/dL (ref 0.61–1.24)
GFR calc Af Amer: >60 mL/min (ref >60)
GFR calc non Af Amer: >60 mL/min (ref >60)
Glucose, Bld: 114 mg/dL — ABNORMAL HIGH (ref 70–99)
Potassium: 3.8 mmol/L (ref 3.5–5.1)
Sodium: 133 mmol/L — ABNORMAL LOW (ref 135–145)

## 2019-05-03 MED ORDER — DEXAMETHASONE SODIUM PHOSPHATE 10 MG/ML IJ SOLN
20.0000 mg | Freq: Once | INTRAMUSCULAR | Status: AC
  Administered 2019-05-03: 15:00:00 20 mg via INTRAVENOUS
  Filled 2019-05-03: qty 2

## 2019-05-03 MED ORDER — LIDOCAINE HCL 2 % IJ SOLN: 10.0000 mL | Freq: Once | INTRAMUSCULAR | Status: DC

## 2019-05-03 MED ORDER — CLINDAMYCIN HCL 150 MG PO CAPS
600.0000 mg | ORAL_CAPSULE | Freq: Once | ORAL | Status: AC
  Administered 2019-05-03: 600 mg via ORAL
  Filled 2019-05-03: qty 4

## 2019-05-03 MED ORDER — CLINDAMYCIN HCL 300 MG PO CAPS: 300.0000 mg | ORAL_CAPSULE | Freq: Four times a day (QID) | ORAL | 0 refills | Status: AC

## 2019-05-03 MED ORDER — IOHEXOL 300 MG/ML  SOLN
75.0000 mL | Freq: Once | INTRAMUSCULAR | Status: AC | PRN
  Administered 2019-05-03: 75 mL via INTRAVENOUS

## 2019-05-03 MED ORDER — ACETAMINOPHEN 325 MG PO TABS
650.0000 mg | ORAL_TABLET | Freq: Once | ORAL | Status: AC
  Administered 2019-05-03: 650 mg via ORAL
  Filled 2019-05-03: qty 2

## 2019-05-03 NOTE — ED Notes (Signed)
Pt came into ED Lobby and states she went to her car and took a nap- would like to be seen now.

## 2019-05-03 NOTE — ED Notes (Signed)
Pt was called x3 to be roomed. No answer.

## 2019-05-03 NOTE — Discharge Instructions (Addendum)
You were given a prescription for antibiotics. Please take the antibiotic prescription fully.   You were given information to follow-up with Dr. Jodean Lima, who is an ear nose and throat doctor.  Please call the office to schedule appointment for follow-up.  Please monitor your symptoms closely, if you experience any fevers, increased swelling of the face, inability to open your mouth, or have any other concerns then please return to the emergency department immediately.

## 2019-05-03 NOTE — ED Triage Notes (Signed)
Patient reports worsening skin abscess at right lower face with redness/swelling and drainage , denies fever or chills .

## 2019-05-03 NOTE — ED Provider Notes (Signed)
[facility] EMERGENCY DEPARTMENT Provider Note   CSN: [id number] Arrival date & time: 05/03/19  0451     History Chief Complaint  Patient presents with  . Skin Abscess    Jeff Ross is a 39 y.o. male.  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  States a few days ago she had an ingrown hair to the right lower part of her face.  She tried to pop it and since then she has developed swelling and redness to the face.  Reports that is warm to touch and has had some drainage from the area.  Denies any difficulty opening her mouth or having any dental pain.  She has had no sweats, chills or documented fevers at home.  No systemic symptoms.  States that HIV is well controlled and levels have been undetectable for years.  Reviewed prior HIV labs completed in 12/2018 and 01/2019 which are reassuring and confirm that HIV is well controlled.   Past Medical History:  Diagnosis Date  . HIV (human immunodeficiency virus infection) (HCC)   . Transgender     Patient Active Problem List   Diagnosis Date Noted  . 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 ANAL FISSURE 01/03/2010  . ONYCHOMYCOSIS, TOENAILS 06/14/2007  . DENTAL CARIES 01/26/2007  . RHINITIS, ALLERGIC NOS 08/31/2006  . Human immunodeficiency virus (HIV) disease (HCC) 05/11/2006    History reviewed. No pertinent surgical history.     No family history on file.  Social History   Tobacco Use  . Smoking status: Current Every Day Smoker    Packs/day: 0.10    Years: 9.00    Pack years: 0.90    Types: Cigarettes  . Smokeless tobacco: Never Used  . Tobacco comment: "cutting back"  Substance Use Topics  . Alcohol use: Yes    Alcohol/week: 2.0 standard drinks    Types: 2 Glasses of wine per week   Comment: "couple times a week"  . Drug use: No    Frequency: 1.0 times per week    Home Medications Prior to Admission medications   Medication Sig Start Date End Date Taking? Authorizing Provider  bictegravir-emtricitabine-tenofovir AF (BIKTARVY) 50-200-25 MG TABS tablet Take 1 tablet by mouth daily. 04/04/19   Veryl Speak, FNP  diphenhydrAMINE (BENADRYL) 25 MG tablet Take 1 tablet (25 mg total) by mouth every 8 (eight) hours as needed. Patient not taking: Reported on 03/30/2018 08/15/17   Rolland Porter, MD  estradiol (CLIMARA - DOSED IN MG/24 HR) 0.1 mg/24hr patch Place 2 patches (0.2 mg total) onto the skin 2 (two) times a week for 30 days. 06/02/18 07/02/18  Blanchard Kelch, NP  estradiol (VIVELLE-DOT) 0.1 MG/24HR patch UNWRAP AND APPLY 2 PATCHES TO SKIN 2 TIMES PER WEEK 10/17/18   Blanchard Kelch, NP  spironolactone (ALDACTONE) 100 MG tablet TAKE 1 TABLET(100 MG) BY MOUTH DAILY 04/03/19   Blanchard Kelch, NP    Allergies    Patient has no known allergies.  Review of Systems   Review of Systems  Constitutional: Negative for chills, diaphoresis and fever.  HENT: Positive for facial swelling. Negative for dental problem.   Respiratory: Negative for shortness of breath.   Cardiovascular: Negative for chest pain.  Gastrointestinal: Negative for abdominal pain, nausea and vomiting.  Genitourinary: Negative for dysuria.  Musculoskeletal:  Negative for myalgias.  Skin: Positive for color change and wound.  Neurological: Negative for headaches.  All other systems reviewed and are negative.   Physical Exam Updated Vital Signs BP 124/76 (BP Location: Right Arm)   Pulse 93   Temp 98.4 F (36.9 C) (Oral)   Resp 16   SpO2 99%   Physical Exam Constitutional:      General: He is not in acute distress.    Appearance: He is well-developed.  HENT:     Mouth/Throat:     Mouth: Mucous membranes are moist.     Comments: Swelling to the right lower aspect of the face with  several small areas of fluctuance surrounded by induration. There is an approximate 3x3cm area of erythema and warmth to the right lower face with central scab that drains purulent fluid when pressure is applied. No trismus or TTP to the teeth. No sublingual or submandibular swelling. Eyes:     Conjunctiva/sclera: Conjunctivae normal.  Cardiovascular:     Rate and Rhythm: Normal rate.  Pulmonary:     Effort: Pulmonary effort is normal.  Musculoskeletal:     Cervical back: Neck supple.  Lymphadenopathy:     Cervical: No cervical adenopathy.  Skin:    General: Skin is warm and dry.  Neurological:     Mental Status: He is alert and oriented to person, place, and time.       ED Results / Procedures / Treatments   Labs (all labs ordered are listed, but only abnormal results are displayed) Labs Reviewed  CBC WITH DIFFERENTIAL/PLATELET - Abnormal; Notable for the following components:      Result Value   WBC 15.0 (*)    RBC 3.98 (*)    MCV 104.3 (*)    RDW 11.2 (*)    Neutro Abs 10.7 (*)    Monocytes Absolute 1.3 (*)    Eosinophils Absolute 0.6 (*)    Abs Immature Granulocytes 0.08 (*)    All other components within normal limits  BASIC METABOLIC PANEL - Abnormal; Notable for the following components:   Sodium 133 (*)    Glucose, Bld 114 (*)    All other components within normal limits    EKG None  Radiology CT Maxillofacial W Contrast  Result Date: 05/03/2019 CLINICAL DATA:  Right facial abscess. Soft tissue swelling and drainage. EXAM: CT MAXILLOFACIAL WITH CONTRAST TECHNIQUE: Multidetector CT imaging of the maxillofacial structures was performed with intravenous contrast. Multiplanar CT image reconstructions were also generated. CONTRAST:  75mL OMNIPAQUE IOHEXOL 300 MG/ML  SOLN COMPARISON:  CT head 11/09/2012 FINDINGS: Osseous: Negative for acute fracture. No skeletal lesion. No dental abscess identified. Orbits: Negative for orbital mass or edema. Normal globe bilaterally.  Chronic fracture right medial orbit, unchanged from 2014 Sinuses: Minimal mucosal edema paranasal sinuses Soft tissues: Extensive soft tissue swelling lateral to the mandible and right face. Dermal abscess right lateral chin extending deep into the soft tissues. Rim enhancing fluid collection is ill-defined but appears to connect with the scan abscess. The deep soft tissue abscess measures approximately 10 x 20 mm. Surrounding cellulitis is present throughout the right face. Limited intracranial: Negative IMPRESSION: Dermal abscess lateral to the right chin. Abscess dissects into the deep soft tissues lateral to the mandible. Soft tissue abscess measures approximately 10 x 20 mm. Surrounding soft tissue swelling and cellulitis. This appears to be a primary skin infection. No evidence of dental infection. Electronically Signed   By: Marlan Palau M.D.   On: 05/03/2019  13:45    Procedures Procedures (including critical care time)  Medications Ordered in ED Medications  lidocaine (XYLOCAINE) 2 % (with pres) injection 200 mg (has no administration in time range)  acetaminophen (TYLENOL) tablet 650 mg (650 mg Oral Given 05/03/19 1225)  clindamycin (CLEOCIN) capsule 600 mg (600 mg Oral Given 05/03/19 1225)  iohexol (OMNIPAQUE) 300 MG/ML solution 75 mL (75 mLs Intravenous Contrast Given 05/03/19 1314)  dexamethasone (DECADRON) injection 20 mg (20 mg Intravenous Given 05/03/19 1511)    ED Course  I have reviewed the triage vital signs and the nursing notes.  Pertinent labs & imaging results that were available during my care of the patient were reviewed by me and considered in my medical decision making (see chart for details).    MDM Rules/Calculators/A&P                      39 y/o with h/o HIV (well controlled) presenting with right sided facial swelling, erythema, induration, and fluctuance. No fevers here, and vs are normal. Pt nontoxic and nonseptic appearing. Given extent of cellulitis with  abscess, will obtain labs and imaging ofr further eval.   CBC with leukocytosis 15,000 BMP nonacute CT maxillofacial with dermal abscess lateral to the right chin. Abscess dissects into the deep soft tissues lateral to the mandible. Soft tissue abscess measures approximately 10 x 20 mm. Surrounding soft tissue swelling and cellulitis. This appears to be a primary skin infection. No evidence of dental infection.   Pt given tylenol and first dose of clindamycin.   2:59 PM CONSULT with Dr. Benjamine Mola who states that patient can either be admitted for IV abx and he will see during admission or patient can be discharged on 300mg  clindamycin QID and f/u in the office with him. He recommended giving 20mg  IV decadron prior to discharge to help with swelling. He does not think that the abscess needs to be I&D'ed today in the ED.   Given the patient is nontoxic-appearing, afebrile and in no acute distress, I do feel that he is appropriate for treatment with p.o. antibiotics and close outpatient follow-up with Dr. Priscille Loveless.  She was given very strict return precautions.  She voices understanding the plan and reasons to return.  All questions answered.  Patient stable for discharge.  Final Clinical Impression(s) / ED Diagnoses Final diagnoses:  Facial abscess    Rx / DC Orders ED Discharge Orders    None       Rodney Booze, PA-C 05/03/19 [place], [place], DO 05/03/19 1613

## 2019-06-11 ENCOUNTER — Other Ambulatory Visit: Payer: Self-pay | Admitting: Infectious Diseases

## 2019-06-11 DIAGNOSIS — B20 Human immunodeficiency virus [HIV] disease: Secondary | ICD-10-CM

## 2019-06-19 ENCOUNTER — Encounter: Payer: Self-pay | Admitting: Infectious Diseases

## 2019-07-11 ENCOUNTER — Other Ambulatory Visit: Payer: Self-pay | Admitting: Infectious Diseases

## 2019-07-11 ENCOUNTER — Other Ambulatory Visit: Payer: Self-pay | Admitting: Family

## 2019-07-11 DIAGNOSIS — B20 Human immunodeficiency virus [HIV] disease: Secondary | ICD-10-CM

## 2019-07-12 ENCOUNTER — Telehealth: Payer: Self-pay

## 2019-07-12 ENCOUNTER — Other Ambulatory Visit: Payer: Self-pay

## 2019-07-12 DIAGNOSIS — F640 Transsexualism: Secondary | ICD-10-CM

## 2019-07-12 DIAGNOSIS — IMO0001 Reserved for inherently not codable concepts without codable children: Secondary | ICD-10-CM

## 2019-07-12 DIAGNOSIS — B20 Human immunodeficiency virus [HIV] disease: Secondary | ICD-10-CM

## 2019-07-12 NOTE — Telephone Encounter (Signed)
Before we refill we would need to clarify her HRT - I see two different estrogen patches listed and she would have had last fill September 2020. Is she taking Spironolactone?  Can you add a estradiol and testosterone level and ensure we have CMET to be drawn as well in addition to VL and CD4.   Thank you.   OK to refill [employer]

## 2019-07-12 NOTE — Telephone Encounter (Signed)
OK to refill all 3 for 30 days.  Will discuss never spacing out ART at office visit.

## 2019-07-12 NOTE — Telephone Encounter (Signed)
Spoke with patient who states she is using both Estadiol patch and Spironolactone.  Is not sure which patch she is taking. States the patch just says Estradiol; also states she has been spacing out her HRT and ART.  Will forward message to NP to advise on refills. Lorenso Courier, [place]

## 2019-07-12 NOTE — Telephone Encounter (Signed)
Called patient to schedule overdue appointment with office after receiving refill request for HRT and Biktarvy. Patient has not seen a provider since 05/2018.  Patient denies missing any doses of ART, but is requesting refills on hormones. Is scheduled for lab work on 3/25 and office visit two weeks after. Will forward message to NP to advise if okay to refill medication Lorenso Courier, CMA

## 2019-07-13 ENCOUNTER — Other Ambulatory Visit: Payer: Self-pay

## 2019-07-13 ENCOUNTER — Telehealth: Payer: Self-pay | Admitting: *Deleted

## 2019-07-13 DIAGNOSIS — Z113 Encounter for screening for infections with a predominantly sexual mode of transmission: Secondary | ICD-10-CM

## 2019-07-13 DIAGNOSIS — F640 Transsexualism: Secondary | ICD-10-CM

## 2019-07-13 DIAGNOSIS — B20 Human immunodeficiency virus [HIV] disease: Secondary | ICD-10-CM

## 2019-07-13 DIAGNOSIS — IMO0001 Reserved for inherently not codable concepts without codable children: Secondary | ICD-10-CM

## 2019-07-13 NOTE — Telephone Encounter (Signed)
Thanks - will look out for her RPR, I am sure she will need some Bicillin

## 2019-07-13 NOTE — Telephone Encounter (Signed)
Patient asked to speak with a nurse after her lab draw. She has what appears to be a healing chancre/ulcer on the shaft of her penis and warty growths on her scrotum.  RPR and Urine cytology with screening for chlamydia/gonorrhea were collected. RN advised patient we would be in touch with results, advised her to keep her provider appointment 4/8, and encouraged condom use with every interaction (patient got condoms in bathroom). Andree Coss, RN

## 2019-07-14 LAB — T-HELPER CELL (CD4) - (RCID CLINIC ONLY)
CD4 % Helper T Cell: 42 % (ref 33–65)
CD4 T Cell Abs: 912 /uL (ref 400–1790)

## 2019-07-14 LAB — URINE CYTOLOGY ANCILLARY ONLY
Chlamydia: NEGATIVE
Comment: NEGATIVE
Comment: NORMAL
Neisseria Gonorrhea: NEGATIVE

## 2019-07-14 NOTE — Progress Notes (Signed)
Jeff Ross needs treatment with 2.4 million units bicillin x 1. 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   Thank you

## 2019-07-17 ENCOUNTER — Other Ambulatory Visit: Payer: Self-pay

## 2019-07-17 ENCOUNTER — Ambulatory Visit (INDEPENDENT_AMBULATORY_CARE_PROVIDER_SITE_OTHER): Payer: Self-pay

## 2019-07-17 ENCOUNTER — Telehealth: Payer: Self-pay | Admitting: *Deleted

## 2019-07-17 DIAGNOSIS — A539 Syphilis, unspecified: Secondary | ICD-10-CM

## 2019-07-17 DIAGNOSIS — A64 Unspecified sexually transmitted disease: Secondary | ICD-10-CM

## 2019-07-17 MED ORDER — PENICILLIN G BENZATHINE 1200000 UNIT/2ML IM SUSP
1.2000 10*6.[IU] | Freq: Once | INTRAMUSCULAR | Status: AC
  Administered 2019-07-17: 1.2 10*6.[IU] via INTRAMUSCULAR

## 2019-07-17 NOTE — Telephone Encounter (Signed)
Relayed to patient, she will come this afternoon for treatment. Andree Coss, RN

## 2019-07-17 NOTE — Telephone Encounter (Signed)
-----   Message from Blanchard Kelch, NP sent at 07/14/2019  1:15 PM EDT ----- Jeff Ross needs treatment with 2.4 million units bicillin x 1. 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   Thank you

## 2019-07-18 LAB — HIV-1 RNA QUANT-NO REFLEX-BLD
HIV 1 RNA Quant: 34 copies/mL — ABNORMAL HIGH
HIV-1 RNA Quant, Log: 1.53 Log copies/mL — ABNORMAL HIGH

## 2019-07-18 LAB — TESTOSTERONE TOTAL,FREE,BIO, MALES
Albumin: 4.4 g/dL (ref 3.6–5.1)
Sex Hormone Binding: 28 nmol/L (ref 10–50)
Testosterone, Bioavailable: 134.8 ng/dL (ref >=110.0)
Testosterone, Free: 66.9 pg/mL (ref 46.0–224.0)
Testosterone: 439 ng/dL (ref 250–827)

## 2019-07-18 LAB — COMPREHENSIVE METABOLIC PANEL
AG Ratio: 1.4 (calc) (ref 1.0–2.5)
ALT: 13 U/L (ref 9–46)
AST: 13 U/L (ref 10–40)
Albumin: 4.4 g/dL (ref 3.6–5.1)
Alkaline phosphatase (APISO): 93 U/L (ref 36–130)
BUN: 13 mg/dL (ref 7–25)
CO2: 28 mmol/L (ref 20–32)
Calcium: 9.1 mg/dL (ref 8.6–10.3)
Chloride: 104 mmol/L (ref 98–110)
Creat: 0.99 mg/dL (ref 0.60–1.35)
Globulin: 3.1 g/dL (calc) (ref 1.9–3.7)
Glucose, Bld: 98 mg/dL (ref 65–99)
Potassium: 3.6 mmol/L (ref 3.5–5.3)
Sodium: 139 mmol/L (ref 135–146)
Total Bilirubin: 0.3 mg/dL (ref 0.2–1.2)
Total Protein: 7.5 g/dL (ref 6.1–8.1)

## 2019-07-18 LAB — RPR TITER: RPR Titer: 1:32 {titer} — ABNORMAL HIGH

## 2019-07-18 LAB — ESTRADIOL: Estradiol: 92 pg/mL — ABNORMAL HIGH (ref <=39)

## 2019-07-18 LAB — RPR: RPR Ser Ql: REACTIVE — AB

## 2019-07-18 LAB — FLUORESCENT TREPONEMAL AB(FTA)-IGG-BLD: Fluorescent Treponemal ABS: REACTIVE — AB

## 2019-07-27 ENCOUNTER — Telehealth: Payer: Self-pay

## 2019-07-27 ENCOUNTER — Ambulatory Visit: Payer: Self-pay | Admitting: Infectious Diseases

## 2019-07-27 NOTE — Telephone Encounter (Signed)
Called patient to follow up on missed appt with Rexene Alberts, NP. Patient states she was unable to come to appointment due to cleaning her room; wanted to have this done before coming to her visit.  Rescheduled patient's appointment for Monday 4/12 at 10 am. Patient understands she MUST come to this appointment to avoid delays in care. Also reminded patient to communicate with office if she is unable to make her appointments to avoid multiple no shows.  Verbalized understanding  Lorenso Courier, CMA

## 2019-07-31 ENCOUNTER — Ambulatory Visit: Payer: Self-pay | Admitting: Infectious Diseases

## 2019-07-31 NOTE — Telephone Encounter (Signed)
Patient has multiple no shows with provider. Will need to reschedule with Pharmacy team Lorenso Courier, CMA

## 2019-08-07 ENCOUNTER — Other Ambulatory Visit: Payer: Self-pay | Admitting: Infectious Diseases

## 2019-08-07 DIAGNOSIS — B20 Human immunodeficiency virus [HIV] disease: Secondary | ICD-10-CM

## 2019-08-09 ENCOUNTER — Other Ambulatory Visit: Payer: Self-pay | Admitting: Infectious Diseases

## 2019-08-09 DIAGNOSIS — B20 Human immunodeficiency virus [HIV] disease: Secondary | ICD-10-CM

## 2020-02-06 ENCOUNTER — Other Ambulatory Visit: Payer: Self-pay

## 2020-02-06 ENCOUNTER — Emergency Department (HOSPITAL_COMMUNITY)
Admission: EM | Admit: 2020-02-06 | Discharge: 2020-02-07 | Disposition: A | Discharge status: Home / Self Care | Payer: Self-pay | Attending: Emergency Medicine | Admitting: Emergency Medicine

## 2020-02-06 DIAGNOSIS — F17210 Nicotine dependence, cigarettes, uncomplicated: Secondary | ICD-10-CM | POA: Insufficient documentation

## 2020-02-06 DIAGNOSIS — R110 Nausea: Secondary | ICD-10-CM | POA: Insufficient documentation

## 2020-02-06 DIAGNOSIS — K1379 Other lesions of oral mucosa: Secondary | ICD-10-CM | POA: Insufficient documentation

## 2020-02-06 DIAGNOSIS — M7918 Myalgia, other site: Secondary | ICD-10-CM | POA: Insufficient documentation

## 2020-02-06 DIAGNOSIS — R197 Diarrhea, unspecified: Secondary | ICD-10-CM | POA: Insufficient documentation

## 2020-02-06 DIAGNOSIS — R21 Rash and other nonspecific skin eruption: Secondary | ICD-10-CM | POA: Insufficient documentation

## 2020-02-06 DIAGNOSIS — Z21 Asymptomatic human immunodeficiency virus [HIV] infection status: Secondary | ICD-10-CM | POA: Insufficient documentation

## 2020-02-06 NOTE — ED Triage Notes (Signed)
Pt from home has multiple complaints including several abscess forming on the body.  Feels generally poor, diarrhea, nausea, sore mouth and aching all over.  Denies fever, LOC.  No medications since May,  Pt eating in triage.

## 2020-02-07 ENCOUNTER — Other Ambulatory Visit: Payer: Self-pay

## 2020-02-07 ENCOUNTER — Encounter (HOSPITAL_COMMUNITY): Payer: Self-pay | Admitting: Emergency Medicine

## 2020-02-07 LAB — COMPREHENSIVE METABOLIC PANEL
ALT: 14 U/L (ref 0–44)
AST: 16 U/L (ref 15–41)
Albumin: 3.4 g/dL — ABNORMAL LOW (ref 3.5–5.0)
Alkaline Phosphatase: 103 U/L (ref 38–126)
Anion gap: 7 (ref 5–15)
BUN: 11 mg/dL (ref 6–20)
CO2: 26 mmol/L (ref 22–32)
Calcium: 8.8 mg/dL — ABNORMAL LOW (ref 8.9–10.3)
Chloride: 102 mmol/L (ref 98–111)
Creatinine, Ser: 1.01 mg/dL (ref 0.61–1.24)
GFR, Estimated: >60 mL/min (ref >60)
Glucose, Bld: 94 mg/dL (ref 70–99)
Potassium: 3.4 mmol/L — ABNORMAL LOW (ref 3.5–5.1)
Sodium: 135 mmol/L (ref 135–145)
Total Bilirubin: 0.3 mg/dL (ref 0.3–1.2)
Total Protein: 8 g/dL (ref 6.5–8.1)

## 2020-02-07 LAB — RPR
RPR Ser Ql: REACTIVE — AB
RPR Titer: 1:128 {titer}

## 2020-02-07 LAB — CBC WITH DIFFERENTIAL/PLATELET
Abs Immature Granulocytes: 0.02 10*3/uL (ref 0.00–0.07)
Basophils Absolute: 0 10*3/uL (ref 0.0–0.1)
Basophils Relative: 1 %
Eosinophils Absolute: 0.5 10*3/uL (ref 0.0–0.5)
Eosinophils Relative: 7 %
HCT: 38.8 % — ABNORMAL LOW (ref 39.0–52.0)
Hemoglobin: 12.1 g/dL — ABNORMAL LOW (ref 13.0–17.0)
Immature Granulocytes: 0 %
Lymphocytes Relative: 30 %
Lymphs Abs: 2 10*3/uL (ref 0.7–4.0)
MCH: 31.8 pg (ref 26.0–34.0)
MCHC: 31.2 g/dL (ref 30.0–36.0)
MCV: 102.1 fL — ABNORMAL HIGH (ref 80.0–100.0)
Monocytes Absolute: 0.6 10*3/uL (ref 0.1–1.0)
Monocytes Relative: 9 %
Neutro Abs: 3.6 10*3/uL (ref 1.7–7.7)
Neutrophils Relative %: 53 %
Platelets: 377 10*3/uL (ref 150–400)
RBC: 3.8 MIL/uL — ABNORMAL LOW (ref 4.22–5.81)
RDW: 11.2 % — ABNORMAL LOW (ref 11.5–15.5)
WBC: 6.8 10*3/uL (ref 4.0–10.5)
nRBC: 0 % (ref 0.0–0.2)

## 2020-02-07 LAB — URINALYSIS, ROUTINE W REFLEX MICROSCOPIC
Bilirubin Urine: NEGATIVE
Glucose, UA: NEGATIVE mg/dL
Hgb urine dipstick: NEGATIVE
Ketones, ur: NEGATIVE mg/dL
Leukocytes,Ua: NEGATIVE
Nitrite: NEGATIVE
Protein, ur: NEGATIVE mg/dL
Specific Gravity, Urine: 1.026 (ref 1.005–1.030)
pH: 5 (ref 5.0–8.0)

## 2020-02-07 MED ORDER — PENICILLIN G BENZATHINE 1200000 UNIT/2ML IM SUSP
2.4000 10*6.[IU] | Freq: Once | INTRAMUSCULAR | Status: AC
  Administered 2020-02-07: 2.4 10*6.[IU] via INTRAMUSCULAR
  Filled 2020-02-07 (×2): qty 4

## 2020-02-07 MED ORDER — DOXYCYCLINE HYCLATE 100 MG PO TABS: 100.0000 mg | ORAL_TABLET | Freq: Once | ORAL | Status: DC

## 2020-02-07 NOTE — ED Provider Notes (Signed)
[facility] EMERGENCY DEPARTMENT Provider Note   CSN: [id number] Arrival date & time: 02/06/20  2344     History Chief Complaint  Patient presents with  . multiple complaints  . Abscess    Alexio Sroka is a 39 y.o. adult.  This is a 39 yo transgender, male-to-male patient with past medical history of HIV, currently not compliant on Biktarvy, presents to the emergency department with a chief complaint of rash.  He reports rash on extremities, around genitals, and anus.  She denies having fevers, but does report having nausea, diarrhea, sores in the mouth, body aches.  She reports that the rash has been worsening over the past few weeks.  She does have history of syphilis.   The history is provided by the patient. No language interpreter was used.       Past Medical History:  Diagnosis Date  . HIV (human immunodeficiency virus infection) (HCC)   . Transgender     Patient Active Problem List   Diagnosis Date Noted  . 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 ANAL FISSURE 01/03/2010  . ONYCHOMYCOSIS, TOENAILS 06/14/2007  . DENTAL CARIES 01/26/2007  . RHINITIS, ALLERGIC NOS 08/31/2006  . Human immunodeficiency virus (HIV) disease (HCC) 05/11/2006    History reviewed. No pertinent surgical history.     No family history on file.  Social History   Tobacco Use  . Smoking status: Current Every Day Smoker    Packs/day: 0.10    Years: 9.00    Pack years: 0.90    Types: Cigarettes  . Smokeless tobacco: Never Used  . Tobacco comment: "cutting back"  Substance Use Topics  . Alcohol use: Yes    Alcohol/week: 2.0 standard drinks    Types: 2 Glasses of wine per week    Comment: "couple times a week"  . Drug use: No    Frequency: 1.0 times per week    Home Medications Prior to Admission medications   Medication Sig Start Date End Date Taking? Authorizing Provider  BIKTARVY  50-200-25 MG TABS tablet TAKE 1 TABLET BY MOUTH DAILY 07/12/19   Blanchard Kelch, NP  diphenhydrAMINE (BENADRYL) 25 MG tablet Take 1 tablet (25 mg total) by mouth every 8 (eight) hours as needed. Patient not taking: Reported on 03/30/2018 08/15/17   Rolland Porter, MD  estradiol (CLIMARA - DOSED IN MG/24 HR) 0.1 mg/24hr patch Place 2 patches (0.2 mg total) onto the skin 2 (two) times a week for 30 days. 06/02/18 07/02/18  Blanchard Kelch, NP  estradiol (VIVELLE-DOT) 0.1 MG/24HR patch APPLY 2 PATCHES(0.2 MG) EXTERNALLY TO THE SKIN 2 TIMES A WEEK 07/12/19   Blanchard Kelch, NP  spironolactone (ALDACTONE) 100 MG tablet TAKE 1 TABLET(100 MG) BY MOUTH DAILY 07/12/19   Blanchard Kelch, NP    Allergies    Patient has no known allergies.  Review of Systems   Review of Systems  All other systems reviewed and are negative.   Physical Exam Updated Vital Signs BP 140/85   Pulse 69   Temp 97.8 F (36.6 C) (Oral)   Resp 16   SpO2 100%   Physical Exam Vitals and nursing note reviewed.  Constitutional:      General: She is not in acute distress.    Appearance: She is well-developed.  HENT:     Head: Normocephalic and atraumatic.  Eyes:     Conjunctiva/sclera: Conjunctivae normal.  Cardiovascular:  Rate and Rhythm: Normal rate and regular rhythm.     Heart sounds: No murmur heard.      Comments: No murmur Pulmonary:     Effort: Pulmonary effort is normal. No respiratory distress.     Breath sounds: Normal breath sounds.  Abdominal:     Palpations: Abdomen is soft.     Tenderness: There is no abdominal tenderness.  Musculoskeletal:     Cervical back: Neck supple.  Skin:    General: Skin is warm and dry.     Comments: Diffuse lesions on extremities, buttocks, anus, genitals, and a few lesions on the palms  Neurological:     Mental Status: She is alert and oriented to person, place, and time.  Psychiatric:        Mood and Affect: Mood normal.        Behavior: Behavior normal.       ED Results / Procedures / Treatments   Labs (all labs ordered are listed, but only abnormal results are displayed) Labs Reviewed  COMPREHENSIVE METABOLIC PANEL - Abnormal; Notable for the following components:      Result Value   Potassium 3.4 (*)    Calcium 8.8 (*)    Albumin 3.4 (*)    All other components within normal limits  CBC WITH DIFFERENTIAL/PLATELET - Abnormal; Notable for the following components:   RBC 3.80 (*)    Hemoglobin 12.1 (*)    HCT 38.8 (*)    MCV 102.1 (*)    RDW 11.2 (*)    All other components within normal limits  URINALYSIS, ROUTINE W REFLEX MICROSCOPIC  RPR    EKG None  Radiology No results found.  Procedures Procedures (including critical care time)  Medications Ordered in ED Medications  penicillin g benzathine (BICILLIN LA) 1200000 UNIT/2ML injection 2.4 Million Units (2.4 Million Units Intramuscular Given 02/07/20 0457)    ED Course  I have reviewed the triage vital signs and the nursing notes.  Pertinent labs & imaging results that were available during my care of the patient were reviewed by me and considered in my medical decision making (see chart for details).    MDM Rules/Calculators/A&P                          Patient here with rash.  History of HIV.  Has had positive RPR in the past.  Rash looks consistent with secondary syphilis.  Will treat with penicillin IM.  Patient does not appear toxic.  Vital signs are stable.  No significant leukocytosis. Patient is referred back to infectious disease.   Final Clinical Impression(s) / ED Diagnoses Final diagnoses:  Rash    Rx / DC Orders ED Discharge Orders    None       Roxy Horseman, PA-C 02/07/20 0546    Palumbo, April, MD 02/07/20 [phone]

## 2020-02-07 NOTE — Discharge Instructions (Addendum)
Your rash is concerning for secondary syphilis.  You have been given the treatment for this in the ED.  You need to follow-up with the infectious disease doctor.  Please call them today.  If your symptoms change or worsen, return to the ER.

## 2020-02-08 LAB — T.PALLIDUM AB, TOTAL: T Pallidum Abs: REACTIVE — AB

## 2021-05-20 ENCOUNTER — Telehealth: Payer: Self-pay

## 2021-05-20 NOTE — Telephone Encounter (Signed)
Patient last seen in 2020. She accepts appointment to re-engage in care and says she has been having trouble finding stable housing. Discussed case management with [facility].   Sandie Ano, RN

## 2021-05-20 NOTE — Telephone Encounter (Signed)
Thank you Megan.

## 2021-05-22 ENCOUNTER — Telehealth: Payer: Self-pay | Admitting: Pharmacist

## 2021-05-22 NOTE — Telephone Encounter (Signed)
Cumulative HIV Genotype Data  Genotype Dates: 2010, 2013, 2019  RT Mutations  M184V,T215FSV179D, P225H, K238T, K103R, V118I, R211K, F214L  PI Mutations  K20T, I13V, L63P,T74S, V77I  Integrase Mutations  none   Interpretation of Genotype Data per [employer] HIV Drug Resistance Database:  Nucleoside RTIs  Abacavir - Low-level resistance Zidovudine - Intermediate resistance Emtricitabine - High-level resistance Lamivudine - High-level resistance Tenofovir - none   Non-Nucleoside RTIs  Doravirine - Low-level resistance Efavirenz - High-level resistance Etravirine - Potential low-level resistance Nevirapine - High-level resistance Rilpivirine - Low-level resistance   Protease Inhibitors  Atazanavir - none Darunavir - none Lopinavir - none   Integrase Inhibitors  Bictegravir - none Cabotegravir - none Dolutegravir - none Elvitegravir - none Raltegravir - none   Margarite Gouge, PharmD, CPP Clinical Pharmacist Practitioner Infectious Diseases Clinical Pharmacist [facility]

## 2021-05-29 ENCOUNTER — Ambulatory Visit (INDEPENDENT_AMBULATORY_CARE_PROVIDER_SITE_OTHER): Payer: Self-pay | Admitting: Pharmacist

## 2021-05-29 ENCOUNTER — Other Ambulatory Visit: Payer: Self-pay

## 2021-05-29 DIAGNOSIS — B20 Human immunodeficiency virus [HIV] disease: Secondary | ICD-10-CM

## 2021-05-29 MED ORDER — PREZCOBIX 800-150 MG PO TABS: 1.0000 | ORAL_TABLET | Freq: Every day | ORAL | 3 refills | Status: DC

## 2021-05-29 MED ORDER — BICTEGRAVIR-EMTRICITAB-TENOFOV 50-200-25 MG PO TABS: 1.0000 | ORAL_TABLET | Freq: Every day | ORAL | 3 refills | Status: DC
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
## 2021-05-30 ENCOUNTER — Telehealth: Payer: Self-pay | Admitting: Pharmacist

## 2021-05-30 LAB — CT/NG RNA, TMA RECTAL
Chlamydia Trachomatis RNA: DETECTED — AB
Neisseria Gonorrhoeae RNA: NOT DETECTED

## 2021-05-30 LAB — GC/CHLAMYDIA PROBE, AMP (THROAT)
Chlamydia trachomatis RNA: NOT DETECTED
Neisseria gonorrhoeae RNA: NOT DETECTED

## 2021-05-30 NOTE — Telephone Encounter (Signed)
LVM with patient requesting callback for lab results. ADAP approved yesterday, and will need doxycycline 100mg  BID x 7 days for rectal chlamydia. Will need to refrain from sexual activity for 1 week following treatment.   , PharmD, CPP Clinical Pharmacist Practitioner Infectious Diseases Clinical Pharmacist [facility]

## 2021-06-02 ENCOUNTER — Other Ambulatory Visit: Payer: Self-pay | Admitting: Pharmacist

## 2021-06-02 DIAGNOSIS — A749 Chlamydial infection, unspecified: Secondary | ICD-10-CM

## 2021-06-02 MED ORDER — DOXYCYCLINE HYCLATE 100 MG PO TABS: 100.0000 mg | ORAL_TABLET | Freq: Two times a day (BID) | ORAL | 0 refills | Status: AC

## 2021-06-02 NOTE — Progress Notes (Signed)
Relayed rectal cytology results to patient over the phone and sent doxycycline to [facility] on [address]. Patient verbalized understanding to take doxycycline twice daily for 1 week and to refrain from sexual activity for 1 week following completing of treatment. Patient's ADAP is approved now.  Margarite Gouge, PharmD, CPP Clinical Pharmacist Practitioner Infectious Diseases Clinical Pharmacist [facility]

## 2021-06-03 ENCOUNTER — Other Ambulatory Visit: Payer: Self-pay | Admitting: Pharmacist

## 2021-06-03 DIAGNOSIS — A749 Chlamydial infection, unspecified: Secondary | ICD-10-CM

## 2021-06-11 ENCOUNTER — Encounter: Payer: Self-pay | Admitting: Infectious Diseases

## 2021-06-15 LAB — LIPID PANEL
Cholesterol: 149 mg/dL (ref <200)
HDL: 38 mg/dL — ABNORMAL LOW (ref >=40)
LDL Cholesterol (Calc): 91 mg/dL (calc)
Non-HDL Cholesterol (Calc): 111 mg/dL (calc) (ref <130)
Total CHOL/HDL Ratio: 3.9 (calc) (ref <5.0)
Triglycerides: 106 mg/dL (ref <150)

## 2021-06-15 LAB — COMPREHENSIVE METABOLIC PANEL
AG Ratio: 1.2 (calc) (ref 1.0–2.5)
ALT: 10 U/L (ref 9–46)
AST: 14 U/L (ref 10–40)
Albumin: 4 g/dL (ref 3.6–5.1)
Alkaline phosphatase (APISO): 115 U/L (ref 36–130)
BUN: 15 mg/dL (ref 7–25)
CO2: 28 mmol/L (ref 20–32)
Calcium: 8.8 mg/dL (ref 8.6–10.3)
Chloride: 106 mmol/L (ref 98–110)
Creat: 0.95 mg/dL (ref 0.60–1.29)
Globulin: 3.4 g/dL (calc) (ref 1.9–3.7)
Glucose, Bld: 98 mg/dL (ref 65–99)
Potassium: 3.9 mmol/L (ref 3.5–5.3)
Sodium: 139 mmol/L (ref 135–146)
Total Bilirubin: 0.3 mg/dL (ref 0.2–1.2)
Total Protein: 7.4 g/dL (ref 6.1–8.1)

## 2021-06-15 LAB — CBC WITH DIFFERENTIAL/PLATELET
Absolute Monocytes: 346 cells/uL (ref 200–950)
Basophils Absolute: 53 cells/uL (ref 0–200)
Basophils Relative: 1.1 %
Eosinophils Absolute: 293 cells/uL (ref 15–500)
Eosinophils Relative: 6.1 %
HCT: 39.2 % (ref 38.5–50.0)
Hemoglobin: 13.3 g/dL (ref 13.2–17.1)
Lymphs Abs: 1363 cells/uL (ref 850–3900)
MCH: 33.5 pg — ABNORMAL HIGH (ref 27.0–33.0)
MCHC: 33.9 g/dL (ref 32.0–36.0)
MCV: 98.7 fL (ref 80.0–100.0)
MPV: 9.2 fL (ref 7.5–12.5)
Monocytes Relative: 7.2 %
Neutro Abs: 2746 cells/uL (ref 1500–7800)
Neutrophils Relative %: 57.2 %
Platelets: 337 10*3/uL (ref 140–400)
RBC: 3.97 10*6/uL — ABNORMAL LOW (ref 4.20–5.80)
RDW: 10.7 % — ABNORMAL LOW (ref 11.0–15.0)
Total Lymphocyte: 28.4 %
WBC: 4.8 10*3/uL (ref 3.8–10.8)

## 2021-06-15 LAB — HIV RNA, RTPCR W/R GT (RTI, PI,INT)
HIV 1 RNA Quant: 11600 copies/mL — ABNORMAL HIGH
HIV-1 RNA Quant, Log: 4.06 Log copies/mL — ABNORMAL HIGH

## 2021-06-15 LAB — HIV-1 INTEGRASE GENOTYPE

## 2021-06-15 LAB — HIV-1 GENOTYPE: HIV-1 Genotype: DETECTED — AB

## 2021-06-15 LAB — C. TRACHOMATIS/N. GONORRHOEAE RNA
C. trachomatis RNA, TMA: NOT DETECTED
N. gonorrhoeae RNA, TMA: NOT DETECTED

## 2021-06-15 LAB — FLUORESCENT TREPONEMAL AB(FTA)-IGG-BLD: Fluorescent Treponemal ABS: REACTIVE — AB

## 2021-06-15 LAB — RPR TITER: RPR Titer: 1:2 {titer} — ABNORMAL HIGH

## 2021-06-15 LAB — RPR: RPR Ser Ql: REACTIVE — AB

## 2021-06-16 NOTE — Progress Notes (Signed)
No changes in cumulative genotype. Marchelle Folks

## 2021-06-20 ENCOUNTER — Telehealth: Payer: Self-pay | Admitting: Pharmacist

## 2021-06-20 ENCOUNTER — Other Ambulatory Visit: Payer: Self-pay | Admitting: Pharmacist

## 2021-06-20 DIAGNOSIS — A749 Chlamydial infection, unspecified: Secondary | ICD-10-CM

## 2021-06-20 MED ORDER — DOXYCYCLINE HYCLATE 100 MG PO TABS: 100.0000 mg | ORAL_TABLET | Freq: Two times a day (BID) | ORAL | 0 refills | Status: DC
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
## 2021-06-30 NOTE — Progress Notes (Deleted)
? ?Name: Jeff Ross  ?DOB: [birth year] ?MRN: [id number] ?PCP: Patient, No Pcp Per (Inactive)  ? ? ? 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 adult F  ?CD4 nadir *** ?VL *** ?HIV Risk: *** ?History of OIs: *** ?Intake Labs ***: ?Hep B sAg (***), sAb (***), cAb (***); Hep A (***), Hep C (***) ?Quantiferon (***) ?HLA B*5701 (***) ?G6PD: (***) ? ? ?Previous Regimens: ?*** ? ?Genotypes: ?*** ? ?Subjective:  ?No chief complaint on file. ?  ? ?HPI: ?*** ? ?Depression screen [facility] 2/9 05/31/2018  ?Decreased Interest 0  ?Down, Depressed, Hopeless 0  ?PHQ - 2 Score 0  ?Altered sleeping -  ?Tired, decreased energy -  ?Change in appetite -  ?Feeling bad or failure about yourself  -  ?Trouble concentrating -  ?Moving slowly or fidgety/restless -  ?Suicidal thoughts -  ?PHQ-9 Score -  ? ? ?Health Maintenance = Receives annual preventative care through {his/her} PCP and is up to date on all recommended screenings and vaccinations. {He/She} denies cigarette use, excessive alcohol use and other substance abuse. {He/She} is seeing a dentist regularly and no dental complaints today.  ? ?ROS ? ?Past Medical History:  ?Diagnosis Date  ? HIV (human immunodeficiency virus infection) ([place])   ? Transgender   ? ? ?Outpatient Medications Prior to Visit  ?Medication Sig Dispense Refill  ? bictegravir-emtricitabine-tenofovir AF (BIKTARVY) 50-200-25 MG TABS tablet Take 1 tablet by mouth daily. 30 tablet 3  ? BIKTARVY 50-200-25 MG TABS tablet TAKE 1 TABLET BY MOUTH DAILY 30 tablet 0  ? darunavir-cobicistat (PREZCOBIX) 800-150 MG tablet Take 1 tablet by mouth daily with breakfast. Swallow whole. Take with food. 30 tablet 3  ? diphenhydrAMINE (BENADRYL) 25 MG tablet Take 1 tablet (25 mg total) by mouth every 8 (eight) hours as needed. (Patient not taking: Reported on 03/30/2018) 6 tablet 0  ? doxycycline (VIBRA-TABS) 100 MG tablet Take 1 tablet (100 mg total) by mouth 2 (two) times daily. 14 tablet 0  ? estradiol (CLIMARA - DOSED IN MG/24 HR)  0.1 mg/24hr patch Place 2 patches (0.2 mg total) onto the skin 2 (two) times a week for 30 days. 16 patch 11  ? estradiol (VIVELLE-DOT) 0.1 MG/24HR patch APPLY 2 PATCHES(0.2 MG) EXTERNALLY TO THE SKIN 2 TIMES A WEEK 16 patch 0  ? spironolactone (ALDACTONE) 100 MG tablet TAKE 1 TABLET(100 MG) BY MOUTH DAILY 30 tablet 0  ? ?No facility-administered medications prior to visit.  ?  ? ?No Known Allergies ? ?Social History  ? ?Tobacco Use  ? Smoking status: Every Day  ?  Packs/day: 0.10  ?  Years: 9.00  ?  Pack years: 0.90  ?  Types: Cigarettes  ? Smokeless tobacco: Never  ? Tobacco comments:  ?  "cutting back"  ?Substance Use Topics  ? Alcohol use: Yes  ?  Alcohol/week: 2.0 standard drinks  ?  Types: 2 Glasses of wine per week  ?  Comment: "couple times a week"  ? Drug use: No  ?  Frequency: 1.0 times per week  ? ? ?No family history on file. ? ?Social History  ? ?Substance and Sexual Activity  ?Sexual Activity Yes  ? Partners: Male  ? Birth control/protection: Condom  ? Comment: given condoms  ? ? ? ?Objective:  ?There were no vitals filed for this visit. ?There is no height or weight on file to calculate BMI. ? ?Physical Exam ? ?Lab Results ?Lab Results  ?Component Value Date  ? WBC 4.8 05/29/2021  ?  HGB 13.3 05/29/2021  ? HCT 39.2 05/29/2021  ? MCV 98.7 05/29/2021  ? PLT 337 05/29/2021  ?  ?Lab Results  ?Component Value Date  ? CREATININE 0.95 05/29/2021  ? BUN 15 05/29/2021  ? NA 139 05/29/2021  ? K 3.9 05/29/2021  ? CL 106 05/29/2021  ? CO2 28 05/29/2021  ?  ?Lab Results  ?Component Value Date  ? ALT 10 05/29/2021  ? AST 14 05/29/2021  ? ALKPHOS 103 02/07/2020  ? BILITOT 0.3 05/29/2021  ?  ?Lab Results  ?Component Value Date  ? CHOL 149 05/29/2021  ? HDL 38 (L) 05/29/2021  ? [place] 91 05/29/2021  ? TRIG 106 05/29/2021  ? CHOLHDL 3.9 05/29/2021  ? ?HIV 1 RNA Quant (copies/mL)  ?Date Value  ?05/29/2021 11,600 (H)  ?07/13/2019 34 (H)  ?12/20/2018 <20 NOT DETECTED  ? ?HIV-1 RNA Viral Load (no units)  ?Date Value   ?01/24/2019 <40  ?05/02/2018 <40  ? ?CD4 (/uL)  ?Date Value  ?05/02/2018 777  ? ?CD4 T Cell Abs (/uL)  ?Date Value  ?07/13/2019 912  ?02/18/2018 630  ?09/29/2017 600  ? ? ? ?Assessment & Plan:  ? ?Problem List Items Addressed This Visit   ?None ? ? ?Janene Madeira, MSN, NP-C ?[facility] ?[facility] ?Pager: [phone] ?Office: [phone] ? ?06/30/21  ?3:45 PM ? ? ?

## 2021-07-01 ENCOUNTER — Ambulatory Visit: Payer: Self-pay | Admitting: Infectious Diseases

## 2021-07-10 ENCOUNTER — Telehealth: Payer: Self-pay | Admitting: Pharmacist

## 2021-07-10 NOTE — Telephone Encounter (Signed)
Patient called requesting review of her STI results from recent visit in February. Reviewed as requested, and as patient available on phone, rescheduled her visit with Colletta Maryland as she missed her visit on 3/14. Stated her brother was very ill and that she could not make her appointment as she remained with her family. Also said she found her Prezcobix last week and has been able to resume taking her HIV regimen (Prezcobix and Biktarvy). Rescheduled to see Colletta Maryland on 4/3. ? ?Alfonse Spruce, PharmD, CPP ?Clinical Pharmacist Practitioner ?Infectious Diseases Clinical Pharmacist ?[facility] ? ?

## 2021-07-21 ENCOUNTER — Ambulatory Visit (INDEPENDENT_AMBULATORY_CARE_PROVIDER_SITE_OTHER): Payer: Self-pay | Admitting: Infectious Diseases

## 2021-07-21 ENCOUNTER — Other Ambulatory Visit: Payer: Self-pay

## 2021-07-21 VITALS — BP 125/68 | HR 97 | Resp 16 | Ht 71.5 in | Wt 190.2 lb

## 2021-07-21 DIAGNOSIS — R21 Rash and other nonspecific skin eruption: Secondary | ICD-10-CM

## 2021-07-21 DIAGNOSIS — Z789 Other specified health status: Secondary | ICD-10-CM

## 2021-07-21 DIAGNOSIS — B20 Human immunodeficiency virus [HIV] disease: Secondary | ICD-10-CM

## 2021-07-21 DIAGNOSIS — A749 Chlamydial infection, unspecified: Secondary | ICD-10-CM | POA: Insufficient documentation

## 2021-07-21 MED ORDER — SPIRONOLACTONE 100 MG PO TABS: ORAL_TABLET | ORAL | 4 refills | Status: AC

## 2021-07-21 MED ORDER — MUPIROCIN CALCIUM 2 % EX CREA: 1.0000 "application " | TOPICAL_CREAM | Freq: Two times a day (BID) | CUTANEOUS | 0 refills | Status: DC

## 2021-07-21 MED ORDER — ESTRADIOL 0.1 MG/24HR TD PTTW: MEDICATED_PATCH | TRANSDERMAL | 4 refills | Status: AC

## 2021-07-21 MED ORDER — AZITHROMYCIN 250 MG PO TABS
1000.0000 mg | ORAL_TABLET | Freq: Once | ORAL | Status: AC
  Administered 2021-07-21: 1000 mg via ORAL

## 2021-07-21 NOTE — Assessment & Plan Note (Signed)
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  ?She has access to her Biktarvy + Preczobix. She knows to always take together never independently and always with food.  ?She says she has been back on them regularly for at least a month now.  ?Will update her VL to see if she has had appropriate drop from Feb and assess CD4 as one has not been done in < 1 year.  ?
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
## 2021-07-21 NOTE — Assessment & Plan Note (Signed)
Reviewed previous HRT - she would like to resume the estradiol patches TD and Spironolactone 100 mg daily. She smokes occasional nicotine and I have counseled her to consider stopping use altogether to reduce her VTE risk.  ?If dizzy with spiro, take 1/2 tab for 3 -5 days then resume a full.  ? ?RTC in 54m to repeat her hormone levels and titrate further ?

## 2021-07-21 NOTE — Assessment & Plan Note (Signed)
Appears to be c/w impetigo. Not severe. Counseled to stop picking her face, keep clean and dry, minimal make up until improved and use topical mupirocin cream for now.  ?
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
## 2021-07-21 NOTE — Patient Instructions (Addendum)
Will treat your previous bacteria with a dose of azithromycin today - this is 4 tabs taken all at once today.  ? ?Please stop by the lab on your way out.  ? ?Will resume your hormone therapy - for the patches apply 2 patches every 3-5 days.  ? ?For the pill (spironolactone) - if you notice any dizziness when you resume this cut it in half and take only 1/2 for a few days then can increase to the whole pill. If you tolerate it OK without dizziness, great!  ?May make you pee more often.  ? ?Please come back in 3 months so we can see how your hormone levels are doing.  ? ?Would be great if you did not smoke tobacco at all - this will lower the risk for blood clots due to the hormone medications.  ? ?For the rash on your face - keep it clean and dry. Try not to pick at it. Will try an antibiotic cream for you to use twice a day over the spots. If this does not work please let us know - we can try an antifungal.  ?

## 2021-07-22 LAB — T-HELPER CELLS (CD4) COUNT (NOT AT ARMC)
CD4 % Helper T Cell: 37 % (ref 33–65)
CD4 T Cell Abs: 514 /uL (ref 400–1790)

## 2021-07-23 LAB — HIV-1 RNA QUANT-NO REFLEX-BLD
HIV 1 RNA Quant: NOT DETECTED Copies/mL
HIV-1 RNA Quant, Log: NOT DETECTED Log cps/mL

## 2021-07-30 ENCOUNTER — Telehealth: Payer: Self-pay

## 2021-07-30 NOTE — Telephone Encounter (Signed)
-----   Message from Blanchard Kelch, NP sent at 07/30/2021  1:22 PM EDT ----- ?Please call Jeff Ross to let her know that her viral load is back to undetectable and her immune system looks very strong with a TCell count 514 ? ?

## 2021-07-30 NOTE — Telephone Encounter (Signed)
Called patient regarding lab results. Does not have any questions at this time. Will follow up as planned. ?Leatrice Jewels, RMA ? ?

## 2021-10-20 ENCOUNTER — Encounter: Payer: Self-pay | Admitting: Infectious Diseases

## 2021-10-20 ENCOUNTER — Ambulatory Visit: Payer: Self-pay

## 2021-10-20 ENCOUNTER — Other Ambulatory Visit: Payer: Self-pay

## 2021-10-20 ENCOUNTER — Ambulatory Visit (INDEPENDENT_AMBULATORY_CARE_PROVIDER_SITE_OTHER): Payer: 59 | Admitting: Infectious Diseases

## 2021-10-20 VITALS — BP 120/72 | HR 76 | Temp 98.1°F | Wt 178.0 lb

## 2021-10-20 DIAGNOSIS — B20 Human immunodeficiency virus [HIV] disease: Secondary | ICD-10-CM | POA: Diagnosis not present

## 2021-10-20 MED ORDER — PREZCOBIX 800-150 MG PO TABS: 1.0000 | ORAL_TABLET | Freq: Every day | ORAL | 5 refills | Status: DC

## 2021-10-20 MED ORDER — BICTEGRAVIR-EMTRICITAB-TENOFOV 50-200-25 MG PO TABS: 1.0000 | ORAL_TABLET | Freq: Every day | ORAL | 5 refills | Status: DC

## 2021-10-20 NOTE — Assessment & Plan Note (Signed)
Intermittent adherence but overall well controlled on once daily Prezcobix + Biktarvy taken together with food. Recent lapse x 29m due to unavailability of medications during incarceration. Back on now and they are tolerating the medication well without side effects. No drug interactions identified. She would like to check VL today to ensure back down appropriately.  Reminded about ADAP re-enrollment in July / January.  No dental needs today.  No concern over anxious/depressed mood.  Sexual health discussed including screening needs - she politely declined today.  Vaccines discussed - she deferred any today. Prevnar and Menveo due next OV.   Return in about 3 months (around 01/20/2022).

## 2021-10-20 NOTE — Progress Notes (Signed)
Name: Jeff Ross  DOB: 24-Feb-1981 MRN: [id number] PCP: Patient, No Pcp Per    Brief Narrative:  Jeff Ross is a 41 y.o. 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 Here after court appointment this AM after recent incarceration for 72m Lost weight while in there.  Was doing well on ARVs and HRT (patches and spiro PO) but then was not able to organize medications in jail before her release. Back on these for about 3 weeks. No s/e or concerns related to those medications. Continues inconsistent tobacco use (cigars).      10/20/2021    1:52 PM  Depression screen PHQ 2/9  Decreased Interest 0  Down, Depressed, Hopeless 0  PHQ - 2 Score 0      Review of Systems  Constitutional:  Negative for chills, fever, malaise/fatigue and weight loss.  HENT:  Negative for sore throat.   Respiratory:  Negative for cough, sputum production and shortness of breath.   Cardiovascular: Negative.   Gastrointestinal:  Negative for abdominal pain, diarrhea and vomiting.  Musculoskeletal:  Negative for joint pain, myalgias and neck pain.  Skin:  Negative for rash.  Neurological:  Negative for headaches.  Psychiatric/Behavioral:  Negative for depression and substance abuse. The patient is not nervous/anxious.      Past Medical History:  Diagnosis Date   HIV (human immunodeficiency virus infection) ([place]    Transgender     Outpatient Medications Prior to Visit  Medication Sig Dispense Refill   diphenhydrAMINE (BENADRYL) 25 MG tablet Take 1 tablet (25 mg total) by mouth every 8 (eight) hours as needed. 6 tablet 0    estradiol (VIVELLE-DOT) 0.1 MG/24HR patch APPLY 2 PATCHES(0.2 MG) EXTERNALLY TO THE SKIN 2 TIMES A WEEK 16 patch 4   mupirocin cream (BACTROBAN) 2 % Apply 1 application. topically 2 (two) times daily. 15 g 0   spironolactone (ALDACTONE) 100 MG tablet TAKE 1 TABLET(100 MG) BY MOUTH DAILY 30 tablet 4   bictegravir-emtricitabine-tenofovir AF (BIKTARVY) 50-200-25 MG TABS tablet Take 1 tablet by mouth daily. 30 tablet 3   darunavir-cobicistat (PREZCOBIX) 800-150 MG tablet Take 1 tablet by mouth daily with breakfast. Swallow whole. Take with food. 30 tablet 3   No facility-administered medications prior to visit.     No Known Allergies  Social History   Tobacco Use   Smoking status: Some Days    Packs/day: 0.10    Years: 9.00    Total pack years: 0.90    Types: Cigarettes   Smokeless tobacco: Never   Tobacco comments:    "cutting back"  Substance Use Topics   Alcohol use: Yes    Alcohol/week: 2.0 standard drinks of alcohol    Types: 2 Glasses of wine per week    Comment: "couple times a week"   Drug use: No    Frequency: 1.0 times per week    No family history on file.  Social History   Substance and Sexual Activity  Sexual Activity Yes   Partners: Male   Birth control/protection: Condom   Comment: given  condoms     Objective:   Vitals:   10/20/21 1351  BP: 120/72  Pulse: 76  Temp: 98.1 F (36.7 C)  TempSrc: Oral  Weight: 178 lb (80.7 kg)   Body mass index is 24.48 kg/m.  Physical Exam Vitals reviewed.  Constitutional:      Appearance: Jeff Ross is well-developed.     Comments: Seated comfortably in chair during visit.   HENT:     Mouth/Throat:     Dentition: Normal dentition. No dental abscesses.  Cardiovascular:     Rate and Rhythm: Normal rate and regular rhythm.     Heart sounds: Normal heart sounds.  Pulmonary:     Effort: Pulmonary effort is normal.     Breath sounds: Normal breath sounds.  Abdominal:     General: There is no distension.      Palpations: Abdomen is soft.     Tenderness: There is no abdominal tenderness.  Lymphadenopathy:     Cervical: No cervical adenopathy.  Skin:    General: Skin is warm and dry.     Findings: No rash.  Neurological:     Mental Status: Jeff Ross is alert and oriented to person, place, and time.  Psychiatric:        Judgment: Judgment normal.     Comments: In good spirits today and engaged in care discussion.      Lab Results Lab Results  Component Value Date   WBC 4.8 05/29/2021   HGB 13.3 05/29/2021   HCT 39.2 05/29/2021   MCV 98.7 05/29/2021   PLT 337 05/29/2021    Lab Results  Component Value Date   CREATININE 0.95 05/29/2021   BUN 15 05/29/2021   NA 139 05/29/2021   K 3.9 05/29/2021   CL 106 05/29/2021   CO2 28 05/29/2021    Lab Results  Component Value Date   ALT 10 05/29/2021   AST 14 05/29/2021   ALKPHOS 103 02/07/2020   BILITOT 0.3 05/29/2021    Lab Results  Component Value Date   CHOL 149 05/29/2021   HDL 38 (L) 05/29/2021   LDLCALC 91 05/29/2021   TRIG 106 05/29/2021   CHOLHDL 3.9 05/29/2021   HIV 1 RNA Quant  Date Value  07/21/2021 Not Detected Copies/mL  05/29/2021 11,600 copies/mL (H)  07/13/2019 34 copies/mL (H)   HIV-1 RNA Viral Load (no units)  Date Value  01/24/2019 <40  05/02/2018 <40   CD4 (/uL)  Date Value  05/02/2018 777   CD4 T Cell Abs (/uL)  Date Value  07/21/2021 514  07/13/2019 912  02/18/2018 630     Assessment & Plan:   Problem List Items Addressed This Visit       Unprioritized   Human immunodeficiency virus (HIV) disease ([place]) - Primary    Intermittent adherence but overall well controlled on once daily Prezcobix + Biktarvy taken together with food. Recent lapse x 60mdue to unavailability of medications during incarceration. Back on now and they are tolerating the medication well without side effects. No drug interactions identified. Jeff Ross would like to check VL today to ensure back down appropriately.  Reminded about  ADAP re-enrollment in July / January.  No dental needs today.  No concern over anxious/depressed mood.  Sexual health discussed including screening needs - Jeff Ross politely declined today.  Vaccines discussed - Jeff Ross deferred any today. Prevnar and Menveo due next OV.   Return in about 3 months (around 01/20/2022).        Relevant Medications  bictegravir-emtricitabine-tenofovir AF (BIKTARVY) 50-200-25 MG TABS tablet   darunavir-cobicistat (PREZCOBIX) 800-150 MG tablet   Other Relevant Orders   HIV 1 RNA quant-no reflex-bld   Other Visit Diagnoses     HIV disease ([place])       Relevant Medications   bictegravir-emtricitabine-tenofovir AF (BIKTARVY) 50-200-25 MG TABS tablet   darunavir-cobicistat (PREZCOBIX) 800-150 MG tablet      Janene Madeira, MSN, NP-C [facility] for Infectious [place] Pager: [phone] Office: [phone]  10/20/21  6:52 PM

## 2021-10-20 NOTE — Patient Instructions (Signed)
Will send in refills of your Biktarvy and Prezcobix - stop by the lab today.   You should have refills of your hormones available for another 1-2 months The pharmacy can send requests to refill when needed and I am happy to take care of that for you.   Will see you back in 3 months. Will update hormone levels at that time to ensure your doses are on track.

## 2021-10-23 LAB — HIV-1 RNA QUANT-NO REFLEX-BLD
HIV 1 RNA Quant: <20 Copies/mL — ABNORMAL HIGH
HIV-1 RNA Quant, Log: <1.3 Log cps/mL — ABNORMAL HIGH

## 2021-10-24 ENCOUNTER — Telehealth: Payer: Self-pay

## 2021-10-24 NOTE — Telephone Encounter (Signed)
-----   Message from Blanchard Kelch, NP sent at 10/24/2021  2:32 PM EDT ----- Please call [name] to let her know that her viral load is still undetectable. Thank you

## 2021-10-24 NOTE — Telephone Encounter (Signed)
Spoke with Jeff Ross, relayed that viral load is undetectable. Patient verbalized understanding and has no further questions.   Sandie Ano, RN

## 2021-11-19 DIAGNOSIS — M25511 Pain in right shoulder: Secondary | ICD-10-CM | POA: Insufficient documentation

## 2021-11-19 DIAGNOSIS — S0292XA Unspecified fracture of facial bones, initial encounter for closed fracture: Secondary | ICD-10-CM | POA: Insufficient documentation

## 2021-11-19 DIAGNOSIS — M25561 Pain in right knee: Secondary | ICD-10-CM | POA: Insufficient documentation

## 2021-11-20 ENCOUNTER — Emergency Department (HOSPITAL_COMMUNITY): Payer: Self-pay

## 2021-11-20 ENCOUNTER — Encounter (HOSPITAL_COMMUNITY): Payer: Self-pay

## 2021-11-20 ENCOUNTER — Other Ambulatory Visit: Payer: Self-pay

## 2021-11-20 ENCOUNTER — Emergency Department (HOSPITAL_COMMUNITY)
Admission: EM | Admit: 2021-11-20 | Discharge: 2021-11-20 | Disposition: A | Discharge status: Home / Self Care | Payer: Self-pay | Attending: Emergency Medicine | Admitting: Emergency Medicine

## 2021-11-20 DIAGNOSIS — S0292XA Unspecified fracture of facial bones, initial encounter for closed fracture: Secondary | ICD-10-CM

## 2021-11-20 MED ORDER — FLUORESCEIN SODIUM 1 MG OP STRP
1.0000 | ORAL_STRIP | Freq: Once | OPHTHALMIC | Status: AC
  Administered 2021-11-20: 1 via OPHTHALMIC
  Filled 2021-11-20: qty 1

## 2021-11-20 MED ORDER — HYDROCODONE-ACETAMINOPHEN 5-325 MG PO TABS
1.0000 | ORAL_TABLET | Freq: Once | ORAL | Status: AC
  Administered 2021-11-20: 1 via ORAL
  Filled 2021-11-20: qty 1

## 2021-11-20 MED ORDER — METHOCARBAMOL 500 MG PO TABS: 500.0000 mg | ORAL_TABLET | Freq: Three times a day (TID) | ORAL | 0 refills | Status: AC

## 2021-11-20 MED ORDER — TETRACAINE HCL 0.5 % OP SOLN
1.0000 [drp] | Freq: Once | OPHTHALMIC | Status: AC
  Administered 2021-11-20: 1 [drp] via OPHTHALMIC
  Filled 2021-11-20: qty 4

## 2021-11-20 MED ORDER — ONDANSETRON 4 MG PO TBDP
4.0000 mg | ORAL_TABLET | Freq: Once | ORAL | Status: AC
  Administered 2021-11-20: 4 mg via ORAL
  Filled 2021-11-20: qty 1

## 2021-11-20 MED ORDER — CYCLOBENZAPRINE HCL 10 MG PO TABS
10.0000 mg | ORAL_TABLET | Freq: Once | ORAL | Status: AC
  Administered 2021-11-20: 10 mg via ORAL
  Filled 2021-11-20: qty 1

## 2021-11-20 MED ORDER — HYDROCODONE-ACETAMINOPHEN 5-325 MG PO TABS: 1.0000 | ORAL_TABLET | ORAL | 0 refills | Status: DC | PRN

## 2021-11-20 MED ORDER — ONDANSETRON HCL 4 MG/2ML IJ SOLN: 4.0000 mg | Freq: Once | INTRAMUSCULAR | Status: DC

## 2021-11-20 NOTE — ED Notes (Signed)
All discharge instructions reviewed with patient including follow up care and prescriptions. Patient verbalized understanding and had no other questions. Patient stable and ambulatory at time of discharge.  
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
## 2021-11-20 NOTE — ED Notes (Signed)
Pt's nose was continuing to bleed, more tissue was provided and triage RN was notified.
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
## 2021-11-21 ENCOUNTER — Other Ambulatory Visit: Payer: Self-pay

## 2021-11-21 ENCOUNTER — Encounter (HOSPITAL_COMMUNITY): Payer: Self-pay

## 2021-11-21 ENCOUNTER — Emergency Department (HOSPITAL_COMMUNITY)
Admission: EM | Admit: 2021-11-21 | Discharge: 2021-11-21 | Disposition: A | Discharge status: Home / Self Care | Payer: 59 | Source: Home / Self Care | Attending: Emergency Medicine | Admitting: Emergency Medicine

## 2021-11-21 ENCOUNTER — Emergency Department (HOSPITAL_COMMUNITY): Payer: 59

## 2021-11-21 DIAGNOSIS — L03115 Cellulitis of right lower limb: Secondary | ICD-10-CM | POA: Insufficient documentation

## 2021-11-21 DIAGNOSIS — D72829 Elevated white blood cell count, unspecified: Secondary | ICD-10-CM | POA: Insufficient documentation

## 2021-11-21 DIAGNOSIS — L0390 Cellulitis, unspecified: Secondary | ICD-10-CM

## 2021-11-21 DIAGNOSIS — M71161 Other infective bursitis, right knee: Secondary | ICD-10-CM | POA: Diagnosis not present

## 2021-11-21 LAB — CBC WITH DIFFERENTIAL/PLATELET
Abs Immature Granulocytes: 0.06 10*3/uL (ref 0.00–0.07)
Basophils Absolute: 0 10*3/uL (ref 0.0–0.1)
Basophils Relative: 0 %
Eosinophils Absolute: 0 10*3/uL (ref 0.0–0.5)
Eosinophils Relative: 0 %
HCT: 39.4 % (ref 39.0–52.0)
Hemoglobin: 13.1 g/dL (ref 13.0–17.0)
Immature Granulocytes: 0 %
Lymphocytes Relative: 7 %
Lymphs Abs: 1 10*3/uL (ref 0.7–4.0)
MCH: 33.7 pg (ref 26.0–34.0)
MCHC: 33.2 g/dL (ref 30.0–36.0)
MCV: 101.3 fL — ABNORMAL HIGH (ref 80.0–100.0)
Monocytes Absolute: 0.8 10*3/uL (ref 0.1–1.0)
Monocytes Relative: 5 %
Neutro Abs: 13.6 10*3/uL — ABNORMAL HIGH (ref 1.7–7.7)
Neutrophils Relative %: 88 %
Platelets: 334 10*3/uL (ref 150–400)
RBC: 3.89 MIL/uL — ABNORMAL LOW (ref 4.22–5.81)
RDW: 11.4 % — ABNORMAL LOW (ref 11.5–15.5)
WBC: 15.6 10*3/uL — ABNORMAL HIGH (ref 4.0–10.5)
nRBC: 0 % (ref 0.0–0.2)

## 2021-11-21 LAB — COMPREHENSIVE METABOLIC PANEL
ALT: 23 U/L (ref 0–44)
AST: 21 U/L (ref 15–41)
Albumin: 3.7 g/dL (ref 3.5–5.0)
Alkaline Phosphatase: 99 U/L (ref 38–126)
Anion gap: 12 (ref 5–15)
BUN: 14 mg/dL (ref 6–20)
CO2: 25 mmol/L (ref 22–32)
Calcium: 9.2 mg/dL (ref 8.9–10.3)
Chloride: 96 mmol/L — ABNORMAL LOW (ref 98–111)
Creatinine, Ser: 0.88 mg/dL (ref 0.61–1.24)
GFR, Estimated: >60 mL/min (ref >60)
Glucose, Bld: 133 mg/dL — ABNORMAL HIGH (ref 70–99)
Potassium: 3.8 mmol/L (ref 3.5–5.1)
Sodium: 133 mmol/L — ABNORMAL LOW (ref 135–145)
Total Bilirubin: 0.5 mg/dL (ref 0.3–1.2)
Total Protein: 7.8 g/dL (ref 6.5–8.1)

## 2021-11-21 MED ORDER — CEPHALEXIN 500 MG PO CAPS: 500.0000 mg | ORAL_CAPSULE | Freq: Four times a day (QID) | ORAL | 0 refills | Status: DC

## 2021-11-21 MED ORDER — ONDANSETRON 4 MG PO TBDP
8.0000 mg | ORAL_TABLET | Freq: Once | ORAL | Status: AC
  Administered 2021-11-21: 8 mg via ORAL
  Filled 2021-11-21: qty 2

## 2021-11-21 MED ORDER — CEPHALEXIN 250 MG PO CAPS
500.0000 mg | ORAL_CAPSULE | Freq: Once | ORAL | Status: AC
Start: 2021-11-21 — End: 2021-11-21
  Administered 2021-11-21: 500 mg via ORAL
  Filled 2021-11-21: qty 2

## 2021-11-21 NOTE — ED Notes (Signed)
Right leg red swollen warm and marked with marker
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
## 2021-11-23 ENCOUNTER — Observation Stay (HOSPITAL_COMMUNITY): Payer: 59

## 2021-11-23 ENCOUNTER — Encounter (HOSPITAL_COMMUNITY): Payer: Self-pay

## 2021-11-23 ENCOUNTER — Inpatient Hospital Stay (HOSPITAL_COMMUNITY)
Admission: EM | Admit: 2021-11-23 | Discharge: 2021-11-26 | DRG: 501 | Disposition: A | Discharge status: Home / Self Care | Payer: 59 | Attending: Internal Medicine | Admitting: Internal Medicine

## 2021-11-23 ENCOUNTER — Emergency Department (HOSPITAL_COMMUNITY): Payer: 59

## 2021-11-23 ENCOUNTER — Other Ambulatory Visit: Payer: Self-pay

## 2021-11-23 DIAGNOSIS — R7982 Elevated C-reactive protein (CRP): Secondary | ICD-10-CM | POA: Diagnosis present

## 2021-11-23 DIAGNOSIS — L02415 Cutaneous abscess of right lower limb: Secondary | ICD-10-CM | POA: Diagnosis present

## 2021-11-23 DIAGNOSIS — L03115 Cellulitis of right lower limb: Secondary | ICD-10-CM | POA: Diagnosis present

## 2021-11-23 DIAGNOSIS — F418 Other specified anxiety disorders: Secondary | ICD-10-CM | POA: Diagnosis present

## 2021-11-23 DIAGNOSIS — Z560 Unemployment, unspecified: Secondary | ICD-10-CM

## 2021-11-23 DIAGNOSIS — B9562 Methicillin resistant Staphylococcus aureus infection as the cause of diseases classified elsewhere: Secondary | ICD-10-CM | POA: Diagnosis present

## 2021-11-23 DIAGNOSIS — M71161 Other infective bursitis, right knee: Principal | ICD-10-CM | POA: Diagnosis present

## 2021-11-23 DIAGNOSIS — D539 Nutritional anemia, unspecified: Secondary | ICD-10-CM | POA: Diagnosis present

## 2021-11-23 DIAGNOSIS — F17210 Nicotine dependence, cigarettes, uncomplicated: Secondary | ICD-10-CM | POA: Diagnosis present

## 2021-11-23 DIAGNOSIS — E871 Hypo-osmolality and hyponatremia: Secondary | ICD-10-CM | POA: Diagnosis present

## 2021-11-23 DIAGNOSIS — Z833 Family history of diabetes mellitus: Secondary | ICD-10-CM

## 2021-11-23 DIAGNOSIS — E861 Hypovolemia: Secondary | ICD-10-CM | POA: Diagnosis present

## 2021-11-23 DIAGNOSIS — Z79899 Other long term (current) drug therapy: Secondary | ICD-10-CM

## 2021-11-23 DIAGNOSIS — E876 Hypokalemia: Secondary | ICD-10-CM | POA: Diagnosis present

## 2021-11-23 DIAGNOSIS — M71169 Other infective bursitis, unspecified knee: Secondary | ICD-10-CM

## 2021-11-23 DIAGNOSIS — Z21 Asymptomatic human immunodeficiency virus [HIV] infection status: Secondary | ICD-10-CM | POA: Diagnosis present

## 2021-11-23 LAB — PROTIME-INR
INR: 1.2 (ref 0.8–1.2)
Prothrombin Time: 14.7 seconds (ref 11.4–15.2)

## 2021-11-23 LAB — SEDIMENTATION RATE: Sed Rate: 78 mm/hr — ABNORMAL HIGH (ref 0–16)

## 2021-11-23 LAB — URINALYSIS, ROUTINE W REFLEX MICROSCOPIC
Glucose, UA: NEGATIVE mg/dL
Hgb urine dipstick: NEGATIVE
Ketones, ur: NEGATIVE mg/dL
Leukocytes,Ua: NEGATIVE
Nitrite: NEGATIVE
Protein, ur: 100 mg/dL — AB
Specific Gravity, Urine: 1.036 — ABNORMAL HIGH (ref 1.005–1.030)
pH: 5 (ref 5.0–8.0)

## 2021-11-23 LAB — COMPREHENSIVE METABOLIC PANEL
ALT: 54 U/L — ABNORMAL HIGH (ref 0–44)
AST: 42 U/L — ABNORMAL HIGH (ref 15–41)
Albumin: 3.5 g/dL (ref 3.5–5.0)
Alkaline Phosphatase: 134 U/L — ABNORMAL HIGH (ref 38–126)
Anion gap: 12 (ref 5–15)
BUN: 18 mg/dL (ref 6–20)
CO2: 24 mmol/L (ref 22–32)
Calcium: 9.1 mg/dL (ref 8.9–10.3)
Chloride: 94 mmol/L — ABNORMAL LOW (ref 98–111)
Creatinine, Ser: 1.05 mg/dL (ref 0.61–1.24)
GFR, Estimated: >60 mL/min (ref >60)
Glucose, Bld: 108 mg/dL — ABNORMAL HIGH (ref 70–99)
Potassium: 3.3 mmol/L — ABNORMAL LOW (ref 3.5–5.1)
Sodium: 130 mmol/L — ABNORMAL LOW (ref 135–145)
Total Bilirubin: 0.9 mg/dL (ref 0.3–1.2)
Total Protein: 8.1 g/dL (ref 6.5–8.1)

## 2021-11-23 LAB — CBC WITH DIFFERENTIAL/PLATELET
Abs Immature Granulocytes: 0.09 10*3/uL — ABNORMAL HIGH (ref 0.00–0.07)
Basophils Absolute: 0 10*3/uL (ref 0.0–0.1)
Basophils Relative: 0 %
Eosinophils Absolute: 0 10*3/uL (ref 0.0–0.5)
Eosinophils Relative: 0 %
HCT: 37.5 % — ABNORMAL LOW (ref 39.0–52.0)
Hemoglobin: 12.1 g/dL — ABNORMAL LOW (ref 13.0–17.0)
Immature Granulocytes: 1 %
Lymphocytes Relative: 3 %
Lymphs Abs: 0.6 10*3/uL — ABNORMAL LOW (ref 0.7–4.0)
MCH: 32.6 pg (ref 26.0–34.0)
MCHC: 32.3 g/dL (ref 30.0–36.0)
MCV: 101.1 fL — ABNORMAL HIGH (ref 80.0–100.0)
Monocytes Absolute: 0.9 10*3/uL (ref 0.1–1.0)
Monocytes Relative: 5 %
Neutro Abs: 16.1 10*3/uL — ABNORMAL HIGH (ref 1.7–7.7)
Neutrophils Relative %: 91 %
Platelets: 345 10*3/uL (ref 150–400)
RBC: 3.71 MIL/uL — ABNORMAL LOW (ref 4.22–5.81)
RDW: 11.2 % — ABNORMAL LOW (ref 11.5–15.5)
WBC: 17.7 10*3/uL — ABNORMAL HIGH (ref 4.0–10.5)
nRBC: 0 % (ref 0.0–0.2)

## 2021-11-23 LAB — MAGNESIUM: Magnesium: 1.8 mg/dL (ref 1.7–2.4)

## 2021-11-23 LAB — APTT: aPTT: 32 seconds (ref 24–36)

## 2021-11-23 LAB — LACTIC ACID, PLASMA: Lactic Acid, Venous: 1.7 mmol/L (ref 0.5–1.9)

## 2021-11-23 LAB — C-REACTIVE PROTEIN: CRP: 17.1 mg/dL — ABNORMAL HIGH (ref <1.0)

## 2021-11-23 MED ORDER — CLINDAMYCIN PHOSPHATE 600 MG/50ML IV SOLN
600.0000 mg | Freq: Three times a day (TID) | INTRAVENOUS | Status: DC
  Administered 2021-11-24 – 2021-11-25 (×6): 600 mg via INTRAVENOUS
  Filled 2021-11-23 (×7): qty 50

## 2021-11-23 MED ORDER — SPIRONOLACTONE 25 MG PO TABS
100.0000 mg | ORAL_TABLET | Freq: Every day | ORAL | Status: DC
  Administered 2021-11-24 (×2): 100 mg via ORAL
  Filled 2021-11-23 (×4): qty 4

## 2021-11-23 MED ORDER — ENOXAPARIN SODIUM 40 MG/0.4ML IJ SOSY
40.0000 mg | PREFILLED_SYRINGE | INTRAMUSCULAR | Status: DC
Start: 2021-11-23 — End: 2021-11-23
  Administered 2021-11-23: 40 mg via SUBCUTANEOUS
  Filled 2021-11-23: qty 0.4

## 2021-11-23 MED ORDER — CEFTRIAXONE SODIUM 2 G IJ SOLR
2.0000 g | INTRAMUSCULAR | Status: DC
  Administered 2021-11-23: 2 g via INTRAVENOUS
  Filled 2021-11-23: qty 20

## 2021-11-23 MED ORDER — BICTEGRAVIR-EMTRICITAB-TENOFOV 50-200-25 MG PO TABS
1.0000 | ORAL_TABLET | Freq: Every day | ORAL | Status: DC
  Administered 2021-11-23 – 2021-11-26 (×4): 1 via ORAL
  Filled 2021-11-23 (×5): qty 1

## 2021-11-23 MED ORDER — PIPERACILLIN-TAZOBACTAM 3.375 G IVPB
3.3750 g | Freq: Three times a day (TID) | INTRAVENOUS | Status: DC
  Administered 2021-11-24 (×2): 3.375 g via INTRAVENOUS
  Filled 2021-11-23 (×2): qty 50

## 2021-11-23 MED ORDER — IOHEXOL 300 MG/ML  SOLN
100.0000 mL | Freq: Once | INTRAMUSCULAR | Status: AC | PRN
  Administered 2021-11-23: 100 mL via INTRAVENOUS

## 2021-11-23 MED ORDER — FENTANYL CITRATE PF 50 MCG/ML IJ SOSY
50.0000 ug | PREFILLED_SYRINGE | Freq: Once | INTRAMUSCULAR | Status: AC
  Administered 2021-11-23: 50 ug via INTRAVENOUS
  Filled 2021-11-23: qty 1

## 2021-11-23 MED ORDER — VANCOMYCIN HCL IN DEXTROSE 1-5 GM/200ML-% IV SOLN
1000.0000 mg | Freq: Once | INTRAVENOUS | Status: AC
  Administered 2021-11-23: 1000 mg via INTRAVENOUS
  Filled 2021-11-23: qty 200

## 2021-11-23 MED ORDER — DARUNAVIR-COBICISTAT 800-150 MG PO TABS
1.0000 | ORAL_TABLET | Freq: Once | ORAL | Status: AC
  Administered 2021-11-24: 1 via ORAL
  Filled 2021-11-23: qty 1

## 2021-11-23 MED ORDER — VANCOMYCIN HCL 1250 MG/250ML IV SOLN
1250.0000 mg | Freq: Two times a day (BID) | INTRAVENOUS | Status: DC
Start: 2021-11-23 — End: 2021-11-26
  Administered 2021-11-23 – 2021-11-26 (×6): 1250 mg via INTRAVENOUS
  Filled 2021-11-23 (×9): qty 250

## 2021-11-23 MED ORDER — SODIUM CHLORIDE 0.9 % IV BOLUS
1000.0000 mL | Freq: Once | INTRAVENOUS | Status: AC
  Administered 2021-11-23: 1000 mL via INTRAVENOUS

## 2021-11-23 MED ORDER — ONDANSETRON 4 MG PO TBDP
4.0000 mg | ORAL_TABLET | Freq: Once | ORAL | Status: AC
Start: 2021-11-23 — End: 2021-11-23
  Administered 2021-11-23: 4 mg via ORAL
  Filled 2021-11-23: qty 1

## 2021-11-23 MED ORDER — POTASSIUM CHLORIDE 10 MEQ/100ML IV SOLN
10.0000 meq | INTRAVENOUS | Status: DC
  Filled 2021-11-23: qty 100

## 2021-11-23 MED ORDER — ESTRADIOL 0.1 MG/24HR TD PTWK
0.1000 mg | MEDICATED_PATCH | TRANSDERMAL | Status: DC
  Administered 2021-11-25: 0.1 mg via TRANSDERMAL
  Filled 2021-11-23: qty 1

## 2021-11-23 MED ORDER — DARUNAVIR-COBICISTAT 800-150 MG PO TABS
1.0000 | ORAL_TABLET | Freq: Every day | ORAL | Status: DC
  Administered 2021-11-24 – 2021-11-26 (×3): 1 via ORAL
  Filled 2021-11-23 (×4): qty 1

## 2021-11-23 MED ORDER — ACETAMINOPHEN 500 MG PO TABS
1000.0000 mg | ORAL_TABLET | Freq: Four times a day (QID) | ORAL | Status: DC | PRN
  Filled 2021-11-23: qty 2

## 2021-11-23 MED ORDER — PIPERACILLIN-TAZOBACTAM 3.375 G IVPB 30 MIN
3.3750 g | Freq: Once | INTRAVENOUS | Status: AC
Start: 2021-11-24 — End: 2021-11-24
  Administered 2021-11-24: 3.375 g via INTRAVENOUS
  Filled 2021-11-23: qty 50

## 2021-11-23 MED ORDER — HYDROMORPHONE HCL 1 MG/ML IJ SOLN
0.5000 mg | Freq: Four times a day (QID) | INTRAMUSCULAR | Status: DC | PRN
  Administered 2021-11-24 – 2021-11-25 (×3): 0.5 mg via INTRAVENOUS
  Filled 2021-11-23 (×4): qty 1

## 2021-11-23 MED ORDER — POTASSIUM CHLORIDE CRYS ER 20 MEQ PO TBCR
40.0000 meq | EXTENDED_RELEASE_TABLET | Freq: Once | ORAL | Status: AC
  Administered 2021-11-24: 40 meq via ORAL
  Filled 2021-11-23: qty 2

## 2021-11-23 NOTE — ED Triage Notes (Signed)
Patient was seen here Friday for cellulitis to right knee and was prescribed antibiotics.  Right knee is now worse and draining purulent drainage.  It is swollen red and hot to the touch.

## 2021-11-23 NOTE — Progress Notes (Signed)
Pharmacy Antibiotic Note  Jeff Ross is a 41 y.o. adult admitted on 11/23/2021 with cellulitis, now w/ concern for necrotizing fasciitis.  Pharmacy has been consulted for Zosyn dosing.  Pt remains on vanc, also starting clinda.  Plan: Zosyn 3.375g IV q8h (4 hour infusion).  Height: 5\' 11"  (180.3 cm) Weight: 81.6 kg (180 lb) IBW/kg (Calculated) : 75.3  Temp (24hrs), Avg:98.7 F (37.1 C), Min:98.5 F (36.9 C), Max:99.1 F (37.3 C)  Recent Labs  Lab 11/21/21 1325 11/23/21 0912  WBC 15.6* 17.7*  CREATININE 0.88 1.05  LATICACIDVEN  --  1.7    Estimated Creatinine Clearance (by C-G formula based on SCr of 1.05 mg/dL) Male: 78 mL/min Male: 97.6 mL/min    No Known Allergies   Thank you for allowing pharmacy to be a part of this patient's care.  01/23/22, PharmD, BCPS  11/23/2021 11:38 PM

## 2021-11-23 NOTE — ED Notes (Signed)
RN placed urinal at bedside  ?

## 2021-11-23 NOTE — ED Provider Notes (Signed)
[facility] EMERGENCY DEPARTMENT Provider Note   CSN: [id number] Arrival date & time: 11/23/21  [id number]     History  Chief Complaint  Patient presents with   Cellulitis    Jeff Ross is a 41 y.o. adult.  Jeff Ross is a 41 y.o. Male who presents with erythema, tenderness, pain, drainage, chills, and worsening redness after oral keflex for prescribed 2 days ago .  Location: R leg  Onset: 5 days ago after picking at skin on Knee  Duration: 5 days and symptoms are worsening  Associated symptoms: lymphangitic streak, edema, and purulent drainage this morning   A        Home Medications Prior to Admission medications   Medication Sig Start Date End Date Taking? Authorizing Provider  bictegravir-emtricitabine-tenofovir AF (BIKTARVY) 50-200-25 MG TABS tablet Take 1 tablet by mouth daily. Patient taking differently: Take 1 tablet by mouth daily with lunch. 10/20/21   Emanuel Callas, NP  cephALEXin (KEFLEX) 500 MG capsule Take 1 capsule (500 mg total) by mouth 4 (four) times daily for 7 days. 11/21/21 11/28/21  Lucrezia Starch, MD  darunavir-cobicistat (PREZCOBIX) 800-150 MG tablet Take 1 tablet by mouth daily with breakfast. Swallow whole. Take with food. Patient taking differently: Take 1 tablet by mouth daily with lunch. Swallow whole. Take with food. 10/20/21   [place] Callas, NP  diphenhydrAMINE (BENADRYL) 25 MG tablet Take 1 tablet (25 mg total) by mouth every 8 (eight) hours as needed. Patient not taking: Reported on 11/21/2021 08/15/17   Tanna Furry, MD  doxycycline (VIBRA-TABS) 100 MG tablet Take 100 mg by mouth 2 (two) times daily. 10/16/21   [provider]  estradiol (VIVELLE-DOT) 0.1 MG/24HR patch APPLY 2 PATCHES(0.2 MG) EXTERNALLY TO THE SKIN 2 TIMES A WEEK Patient taking differently: Place 1 patch onto the skin 2 (two) times a week. 07/21/21   [place] Callas, NP  HYDROcodone-acetaminophen (NORCO/VICODIN) 5-325 MG tablet Take 1 tablet by mouth  every 4 (four) hours as needed for up to 5 days. Patient taking differently: Take 1 tablet by mouth every 4 (four) hours as needed (pain). 11/20/21 11/25/21  Janeece Fitting, PA-C  methocarbamol (ROBAXIN) 500 MG tablet Take 1 tablet (500 mg total) by mouth 3 (three) times daily for 7 days. 11/20/21 11/27/21  Janeece Fitting, PA-C  mupirocin cream (BACTROBAN) 2 % Apply 1 application. topically 2 (two) times daily. Patient not taking: Reported on 11/21/2021 07/21/21   [place] Callas, NP  spironolactone (ALDACTONE) 100 MG tablet TAKE 1 TABLET(100 MG) BY MOUTH DAILY Patient taking differently: Take 100 mg by mouth daily with lunch. 07/21/21   [place] Callas, NP      Allergies    Patient has no known allergies.    Review of Systems   Review of Systems  Physical Exam Updated Vital Signs BP 130/83   Pulse (!) 104   Temp 99.1 F (37.3 C) (Oral)   Resp 18   Ht 5\' 11"  (1.803 m)   Wt 81.6 kg   SpO2 99%   BMI 25.10 kg/m  Physical Exam Vitals and nursing note reviewed.  Constitutional:      General: She is not in acute distress.    Appearance: She is well-developed. She is not diaphoretic.  HENT:     Head: Normocephalic and atraumatic.     Right Ear: External ear normal.     Left Ear: External ear normal.     Nose: Nose normal.     Mouth/Throat:  Mouth: Mucous membranes are moist.  Eyes:     General: No scleral icterus.    Conjunctiva/sclera: Conjunctivae normal.  Cardiovascular:     Rate and Rhythm: Normal rate and regular rhythm.     Heart sounds: Normal heart sounds. No murmur heard.    No friction rub. No gallop.  Pulmonary:     Effort: Pulmonary effort is normal. No respiratory distress.     Breath sounds: Normal breath sounds.  Abdominal:     General: Bowel sounds are normal. There is no distension.     Palpations: Abdomen is soft. There is no mass.     Tenderness: There is no abdominal tenderness. There is no guarding.  Musculoskeletal:     Cervical back: Normal range of  motion.     Comments: Full range of motion of the right knee joint  Skin:    General: Skin is warm and dry.     Comments: Diffuse erythema with centralized erythema and tenderness over the knee.  There is an punctate area without active drainage, lymphangitis up the medial thigh, erythema extends to the entire anterior shin.  Neurological:     Mental Status: She is alert and oriented to person, place, and time.  Psychiatric:        Behavior: Behavior normal.       ED Results / Procedures / Treatments   Labs (all labs ordered are listed, but only abnormal results are displayed) Labs Reviewed  CULTURE, BLOOD (ROUTINE X 2)  CULTURE, BLOOD (ROUTINE X 2)  LACTIC ACID, PLASMA  LACTIC ACID, PLASMA  COMPREHENSIVE METABOLIC PANEL  CBC WITH DIFFERENTIAL/PLATELET  PROTIME-INR  APTT  URINALYSIS, ROUTINE W REFLEX MICROSCOPIC    EKG EKG Interpretation  Date/Time:  Sunday November 23 2021 08:53:59 EDT Ventricular Rate:  100 PR Interval:  145 QRS Duration: 102 QT Interval:  318 QTC Calculation: 411 R Axis:   -5 Text Interpretation: Sinus tachycardia Probable left atrial enlargement No old tracing to compare Confirmed by Melene Plan [phone]) on 11/23/2021 8:56:06 AM  Radiology DG Knee Complete 4 Views Right  Result Date: 11/21/2021 CLINICAL DATA:  Right knee pain and swelling. Possible septic arthritis. EXAM: RIGHT KNEE - COMPLETE 4+ VIEW COMPARISON:  November 20, 2021. FINDINGS: No evidence of fracture, dislocation, or joint effusion. No evidence of arthropathy or other focal bone abnormality. Soft tissues are unremarkable. IMPRESSION: Negative. Electronically Signed   By: Lupita Raider M.D.   On: 11/21/2021 13:46    Procedures Procedures    Medications Ordered in ED Medications  sodium chloride 0.9 % bolus 1,000 mL (has no administration in time range)  vancomycin (VANCOCIN) IVPB 1000 mg/200 mL premix (has no administration in time range)  fentaNYL (SUBLIMAZE) injection 50 mcg (has no  administration in time range)  ondansetron (ZOFRAN-ODT) disintegrating tablet 4 mg (has no administration in time range)    ED Course/ Medical Decision Making/ A&P Clinical Course as of 11/23/21 1004  Sun Nov 23, 2021  1003 WBC(!): 17.7 [AH]    Clinical Course User Index [AH] Arthor Captain, PA-C                           Medical Decision Making Patient here with known cellulitis who presents with worsening pain, redness and extension of infection up the leg. I considered potential for sepsis however patient lactic acid is not elevated.  He has leukocytosis of 17.7, mildly elevated AST a LT and alkaline phosphatase, no  other significant abnormalities.  Urine does not appear infected.  I visualized and interpreted a 1 view chest x-ray which shows no acute findings.  I also visualized and interpreted right knee x-ray which shows no evidence of bony erosion suggestive of osteomyelitis or septic cutaneous gas suggestive of necrotizing fasciitis.  Patient started on IV vancomycin. Case discussed with Dr. Evie Lacks who will admit the patient.  Review of EMR shows patient had no detectable viral load and a normal CD4 count.  Amount and/or Complexity of Data Reviewed Labs: ordered. Decision-making details documented in ED Course. Radiology: ordered. ECG/medicine tests: ordered.  Risk Prescription drug management. Decision regarding hospitalization.          Final Clinical Impression(s) / ED Diagnoses Final diagnoses:  None    Rx / DC Orders ED Discharge Orders     None         Arthor Captain, PA-C 11/23/21 1640    Melene Plan, DO 11/24/21 1135
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
## 2021-11-23 NOTE — Progress Notes (Signed)
Pharmacy Antibiotic Note  Jeff Ross is a 41 y.o. adult admitted on 11/23/2021 with cellulitis and possible septic bursitis. Patient took Keflex x2 days PTA. Pharmacy has been consulted for vancomycin and cefepime dosing.  WBC elevated, afebrile. Received vancomycin 1000 mg IV x1 in AM. Discussed narrowing cefepime to ceftriaxone given unlikelihood of pseudomonas in knee, team agreed.   Vancomycin AUC kinetics:  eAUC 498; Vd: 0.72; Scr: 1.05  Plan: Vancomycin 1250 mg IV q12hr  Ceftriaxone 2 g IV q24hr Monitor CBC, clinical progress, LOT, renal function, vancomycin levels as appropriate   Height: 5\' 11"  (180.3 cm) Weight: 81.6 kg (180 lb) IBW/kg (Calculated) : 75.3  Temp (24hrs), Avg:98.7 F (37.1 C), Min:98.5 F (36.9 C), Max:99.1 F (37.3 C)  Recent Labs  Lab 11/21/21 1325 11/23/21 0912  WBC 15.6* 17.7*  CREATININE 0.88 1.05  LATICACIDVEN  --  1.7    Estimated Creatinine Clearance (by C-G formula based on SCr of 1.05 mg/dL) Male: 9 mL/min Male: 97.6 mL/min    No Known Allergies  Antimicrobials this admission: 8/6 Vancomycin >>  8/6 Ceftriaxone >>    Microbiology results: 8/6 BCx: sent 8/6 Wound Cx: sent    Thank you for allowing pharmacy to be a part of this patient's care.  10/6, PharmD Pharmacy Resident  11/23/2021 5:52 PM

## 2021-11-23 NOTE — Plan of Care (Signed)
  Problem: Education: Goal: Knowledge of General Education information will improve Description Including pain rating scale, medication(s)/side effects and non-pharmacologic comfort measures Outcome: Progressing   
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
## 2021-11-23 NOTE — Progress Notes (Signed)
New Admission Note:   Arrival Method: stretcher  Mental Orientation: Alert and oriented  Telemetry: none  Assessment: Completed Skin: right leg swollen  IV: right and left AC  Pain: 10/10  Tubes: Safety Measures: Safety Fall Prevention Plan has been given, discussed and signed Admission: Completed [facility] Orientation: Patient has been orientated to the room, unit and staff.  Family: none   Orders have been reviewed and implemented. Will continue to monitor the patient. Call light has been placed within reach and bed alarm has been activated.   Keahi Mccarney RN [facility] Phone: [phone]

## 2021-11-24 ENCOUNTER — Inpatient Hospital Stay (HOSPITAL_COMMUNITY): Payer: 59 | Admitting: Anesthesiology

## 2021-11-24 ENCOUNTER — Encounter (HOSPITAL_COMMUNITY): Payer: Self-pay | Admitting: Internal Medicine

## 2021-11-24 ENCOUNTER — Other Ambulatory Visit: Payer: Self-pay

## 2021-11-24 ENCOUNTER — Encounter (HOSPITAL_COMMUNITY)
Admission: EM | Disposition: A | Discharge status: Home / Self Care | Payer: Self-pay | Source: Home / Self Care | Attending: Internal Medicine

## 2021-11-24 DIAGNOSIS — Z21 Asymptomatic human immunodeficiency virus [HIV] infection status: Secondary | ICD-10-CM

## 2021-11-24 DIAGNOSIS — Z833 Family history of diabetes mellitus: Secondary | ICD-10-CM | POA: Diagnosis not present

## 2021-11-24 DIAGNOSIS — F17210 Nicotine dependence, cigarettes, uncomplicated: Secondary | ICD-10-CM | POA: Diagnosis present

## 2021-11-24 DIAGNOSIS — F640 Transsexualism: Secondary | ICD-10-CM

## 2021-11-24 DIAGNOSIS — L03115 Cellulitis of right lower limb: Secondary | ICD-10-CM | POA: Diagnosis present

## 2021-11-24 DIAGNOSIS — B20 Human immunodeficiency virus [HIV] disease: Secondary | ICD-10-CM

## 2021-11-24 DIAGNOSIS — E871 Hypo-osmolality and hyponatremia: Secondary | ICD-10-CM | POA: Diagnosis present

## 2021-11-24 DIAGNOSIS — E876 Hypokalemia: Secondary | ICD-10-CM

## 2021-11-24 DIAGNOSIS — F418 Other specified anxiety disorders: Secondary | ICD-10-CM | POA: Diagnosis present

## 2021-11-24 DIAGNOSIS — M71161 Other infective bursitis, right knee: Principal | ICD-10-CM

## 2021-11-24 DIAGNOSIS — Z79899 Other long term (current) drug therapy: Secondary | ICD-10-CM | POA: Diagnosis not present

## 2021-11-24 DIAGNOSIS — Z560 Unemployment, unspecified: Secondary | ICD-10-CM | POA: Diagnosis not present

## 2021-11-24 DIAGNOSIS — M1990 Unspecified osteoarthritis, unspecified site: Secondary | ICD-10-CM

## 2021-11-24 DIAGNOSIS — E861 Hypovolemia: Secondary | ICD-10-CM | POA: Diagnosis present

## 2021-11-24 DIAGNOSIS — D539 Nutritional anemia, unspecified: Secondary | ICD-10-CM | POA: Diagnosis present

## 2021-11-24 DIAGNOSIS — R7982 Elevated C-reactive protein (CRP): Secondary | ICD-10-CM | POA: Diagnosis present

## 2021-11-24 DIAGNOSIS — B9562 Methicillin resistant Staphylococcus aureus infection as the cause of diseases classified elsewhere: Secondary | ICD-10-CM | POA: Diagnosis present

## 2021-11-24 DIAGNOSIS — L02415 Cutaneous abscess of right lower limb: Secondary | ICD-10-CM | POA: Diagnosis present

## 2021-11-24 HISTORY — PX: I & D EXTREMITY: SHX5045

## 2021-11-24 LAB — BASIC METABOLIC PANEL
Anion gap: 8 (ref 5–15)
BUN: 11 mg/dL (ref 6–20)
CO2: 22 mmol/L (ref 22–32)
Calcium: 8.3 mg/dL — ABNORMAL LOW (ref 8.9–10.3)
Chloride: 104 mmol/L (ref 98–111)
Creatinine, Ser: 0.92 mg/dL (ref 0.61–1.24)
GFR, Estimated: >60 mL/min (ref >60)
Glucose, Bld: 98 mg/dL (ref 70–99)
Potassium: 4.1 mmol/L (ref 3.5–5.1)
Sodium: 134 mmol/L — ABNORMAL LOW (ref 135–145)

## 2021-11-24 LAB — CBC WITH DIFFERENTIAL/PLATELET
Abs Immature Granulocytes: 0.04 10*3/uL (ref 0.00–0.07)
Basophils Absolute: 0 10*3/uL (ref 0.0–0.1)
Basophils Relative: 0 %
Eosinophils Absolute: 0.1 10*3/uL (ref 0.0–0.5)
Eosinophils Relative: 1 %
HCT: 30.1 % — ABNORMAL LOW (ref 39.0–52.0)
Hemoglobin: 10.1 g/dL — ABNORMAL LOW (ref 13.0–17.0)
Immature Granulocytes: 0 %
Lymphocytes Relative: 15 %
Lymphs Abs: 1.4 10*3/uL (ref 0.7–4.0)
MCH: 33.7 pg (ref 26.0–34.0)
MCHC: 33.6 g/dL (ref 30.0–36.0)
MCV: 100.3 fL — ABNORMAL HIGH (ref 80.0–100.0)
Monocytes Absolute: 1 10*3/uL (ref 0.1–1.0)
Monocytes Relative: 10 %
Neutro Abs: 6.7 10*3/uL (ref 1.7–7.7)
Neutrophils Relative %: 74 %
Platelets: 311 10*3/uL (ref 150–400)
RBC: 3 MIL/uL — ABNORMAL LOW (ref 4.22–5.81)
RDW: 11.5 % (ref 11.5–15.5)
WBC: 9.3 10*3/uL (ref 4.0–10.5)
nRBC: 0 % (ref 0.0–0.2)

## 2021-11-24 LAB — SURGICAL PCR SCREEN
MRSA, PCR: POSITIVE — AB
Staphylococcus aureus: POSITIVE — AB

## 2021-11-24 SURGERY — IRRIGATION AND DEBRIDEMENT EXTREMITY
Anesthesia: General | Laterality: Right

## 2021-11-24 MED ORDER — OXYCODONE HCL 5 MG PO TABS: 5.0000 mg | ORAL_TABLET | Freq: Once | ORAL | Status: DC | PRN

## 2021-11-24 MED ORDER — LACTATED RINGERS IV SOLN: INTRAVENOUS | Status: DC

## 2021-11-24 MED ORDER — BUPIVACAINE HCL 0.5 % IJ SOLN
INTRAMUSCULAR | Status: AC
  Filled 2021-11-24: qty 1

## 2021-11-24 MED ORDER — CHLORHEXIDINE GLUCONATE CLOTH 2 % EX PADS
6.0000 | MEDICATED_PAD | Freq: Every day | CUTANEOUS | Status: DC
  Administered 2021-11-24 – 2021-11-26 (×2): 6 via TOPICAL

## 2021-11-24 MED ORDER — MIDAZOLAM HCL 2 MG/2ML IJ SOLN
INTRAMUSCULAR | Status: AC
  Filled 2021-11-24: qty 2

## 2021-11-24 MED ORDER — LACTATED RINGERS IV SOLN: INTRAVENOUS | Status: DC | PRN

## 2021-11-24 MED ORDER — MEPERIDINE HCL 25 MG/ML IJ SOLN: 6.2500 mg | INTRAMUSCULAR | Status: DC | PRN

## 2021-11-24 MED ORDER — PROPOFOL 10 MG/ML IV BOLUS
INTRAVENOUS | Status: DC | PRN
  Administered 2021-11-24: 180 mg via INTRAVENOUS

## 2021-11-24 MED ORDER — LIDOCAINE 2% (20 MG/ML) 5 ML SYRINGE
INTRAMUSCULAR | Status: DC | PRN
  Administered 2021-11-24: 50 mg via INTRAVENOUS

## 2021-11-24 MED ORDER — MUPIROCIN 2 % EX OINT
1.0000 | TOPICAL_OINTMENT | Freq: Two times a day (BID) | CUTANEOUS | Status: DC
  Administered 2021-11-24 – 2021-11-26 (×5): 1 via NASAL
  Filled 2021-11-24: qty 22

## 2021-11-24 MED ORDER — ACETAMINOPHEN 325 MG PO TABS: 325.0000 mg | ORAL_TABLET | ORAL | Status: DC | PRN

## 2021-11-24 MED ORDER — 0.9 % SODIUM CHLORIDE (POUR BTL) OPTIME
TOPICAL | Status: DC | PRN
  Administered 2021-11-24: 1000 mL

## 2021-11-24 MED ORDER — FENTANYL CITRATE (PF) 250 MCG/5ML IJ SOLN
INTRAMUSCULAR | Status: AC
  Filled 2021-11-24: qty 5

## 2021-11-24 MED ORDER — CHLORHEXIDINE GLUCONATE 0.12 % MT SOLN
15.0000 mL | Freq: Once | OROMUCOSAL | Status: DC
  Filled 2021-11-24: qty 15

## 2021-11-24 MED ORDER — ACETAMINOPHEN 160 MG/5ML PO SOLN: 325.0000 mg | ORAL | Status: DC | PRN

## 2021-11-24 MED ORDER — FENTANYL CITRATE (PF) 100 MCG/2ML IJ SOLN
25.0000 ug | INTRAMUSCULAR | Status: DC | PRN
  Administered 2021-11-24 (×2): 50 ug via INTRAVENOUS

## 2021-11-24 MED ORDER — FENTANYL CITRATE (PF) 250 MCG/5ML IJ SOLN
INTRAMUSCULAR | Status: DC | PRN
  Administered 2021-11-24: 100 ug via INTRAVENOUS

## 2021-11-24 MED ORDER — SODIUM CHLORIDE 0.9 % IR SOLN
Status: DC | PRN
  Administered 2021-11-24: 3000 mL

## 2021-11-24 MED ORDER — MIDAZOLAM HCL 2 MG/2ML IJ SOLN
INTRAMUSCULAR | Status: DC | PRN
  Administered 2021-11-24: 2 mg via INTRAVENOUS

## 2021-11-24 MED ORDER — OXYCODONE HCL 5 MG/5ML PO SOLN: 5.0000 mg | Freq: Once | ORAL | Status: DC | PRN

## 2021-11-24 MED ORDER — PROPOFOL 10 MG/ML IV BOLUS
INTRAVENOUS | Status: AC
  Filled 2021-11-24: qty 20

## 2021-11-24 MED ORDER — FENTANYL CITRATE (PF) 100 MCG/2ML IJ SOLN
INTRAMUSCULAR | Status: AC
  Filled 2021-11-24: qty 2

## 2021-11-24 MED ORDER — ONDANSETRON HCL 4 MG/2ML IJ SOLN
INTRAMUSCULAR | Status: DC | PRN
  Administered 2021-11-24: 4 mg via INTRAVENOUS

## 2021-11-24 MED ORDER — ONDANSETRON HCL 4 MG/2ML IJ SOLN: 4.0000 mg | Freq: Once | INTRAMUSCULAR | Status: DC | PRN

## 2021-11-24 SURGICAL SUPPLY — 47 items
BAG COUNTER SPONGE SURGICOUNT (BAG) ×2 IMPLANT
BANDAGE ESMARK 6X9 LF (GAUZE/BANDAGES/DRESSINGS) ×1 IMPLANT
BNDG COHESIVE 6X5 TAN ST LF (GAUZE/BANDAGES/DRESSINGS) ×1 IMPLANT
BNDG ELASTIC 6X5.8 VLCR STR LF (GAUZE/BANDAGES/DRESSINGS) ×1 IMPLANT
BNDG ESMARK 6X9 LF (GAUZE/BANDAGES/DRESSINGS)
BNDG GAUZE ELAST 4 BULKY (GAUZE/BANDAGES/DRESSINGS) ×2 IMPLANT
CNTNR URN SCR LID CUP LEK RST (MISCELLANEOUS) IMPLANT
CONT SPEC 4OZ STRL OR WHT (MISCELLANEOUS) ×2
COVER SURGICAL LIGHT HANDLE (MISCELLANEOUS) ×2 IMPLANT
CUFF TOURN SGL QUICK 18X4 (TOURNIQUET CUFF) IMPLANT
CUFF TOURN SGL QUICK 34 (TOURNIQUET CUFF)
CUFF TRNQT CYL 34X4.125X (TOURNIQUET CUFF) IMPLANT
DRAIN PENROSE .5X12 LATEX STL (DRAIN) ×1 IMPLANT
DRSG ADAPTIC 3X8 NADH LF (GAUZE/BANDAGES/DRESSINGS) ×1 IMPLANT
DRSG PAD ABDOMINAL 8X10 ST (GAUZE/BANDAGES/DRESSINGS) ×4 IMPLANT
DURAPREP 26ML APPLICATOR (WOUND CARE) ×2 IMPLANT
FACESHIELD WRAPAROUND (MASK) ×2 IMPLANT
FACESHIELD WRAPAROUND OR TEAM (MASK) IMPLANT
GAUZE SPONGE 4X4 12PLY STRL (GAUZE/BANDAGES/DRESSINGS) ×2 IMPLANT
GLOVE BIOGEL M 7.0 STRL (GLOVE) ×2 IMPLANT
GLOVE BIOGEL M STRL SZ7.5 (GLOVE) IMPLANT
GLOVE BIOGEL PI IND STRL 7.5 (GLOVE) ×2 IMPLANT
GLOVE BIOGEL PI IND STRL 8.5 (GLOVE) ×1 IMPLANT
GLOVE BIOGEL PI INDICATOR 7.5 (GLOVE) ×2
GLOVE BIOGEL PI INDICATOR 8.5 (GLOVE) ×1
GLOVE ECLIPSE 8.0 STRL XLNG CF (GLOVE) IMPLANT
GLOVE ORTHO TXT STRL SZ7.5 (GLOVE) ×4 IMPLANT
GLOVE SURG ORTHO 8.5 STRL (GLOVE) ×6 IMPLANT
GOWN STRL REUS W/ TWL LRG LVL3 (GOWN DISPOSABLE) ×1 IMPLANT
GOWN STRL REUS W/ TWL XL LVL3 (GOWN DISPOSABLE) ×3 IMPLANT
GOWN STRL REUS W/TWL LRG LVL3 (GOWN DISPOSABLE) ×2
GOWN STRL REUS W/TWL XL LVL3 (GOWN DISPOSABLE) ×6
HANDPIECE INTERPULSE COAX TIP (DISPOSABLE) ×2
KIT BASIN OR (CUSTOM PROCEDURE TRAY) ×2 IMPLANT
MANIFOLD NEPTUNE II (INSTRUMENTS) ×2 IMPLANT
PACK ORTHO EXTREMITY (CUSTOM PROCEDURE TRAY) ×2 IMPLANT
PAD CAST 4YDX4 CTTN HI CHSV (CAST SUPPLIES) ×1 IMPLANT
PADDING CAST ABS 4INX4YD NS (CAST SUPPLIES) ×1
PADDING CAST ABS COTTON 4X4 ST (CAST SUPPLIES) IMPLANT
PADDING CAST COTTON 4X4 STRL (CAST SUPPLIES) ×2
SET HNDPC FAN SPRY TIP SCT (DISPOSABLE) ×1 IMPLANT
STOCKINETTE IMPERVIOUS LG (DRAPES) ×1 IMPLANT
SUT ETHILON 2 0 PSLX (SUTURE) ×1 IMPLANT
SYR CONTROL 10ML LL (SYRINGE) ×2 IMPLANT
TOWEL GREEN STERILE (TOWEL DISPOSABLE) ×4 IMPLANT
TUBE CONNECTING 20X1/4 (TUBING) ×1 IMPLANT
YANKAUER SUCT BULB TIP NO VENT (SUCTIONS) ×1 IMPLANT
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
## 2021-11-24 NOTE — Interval H&P Note (Signed)
History and Physical Interval Note:  11/24/2021 7:57 PM  Jeff Ross  has presented today for surgery, with the diagnosis of Septic Lower Right Knee.  The various methods of treatment have been discussed with the patient and family. After consideration of risks, benefits and other options for treatment, the patient has consented to  Procedure(s): IRRIGATION AND DEBRIDEMENT KNEE (Right) as a surgical intervention.  The patient's history has been reviewed, patient examined, no change in status, stable for surgery.  I have reviewed the patient's chart and labs.  Questions were answered to the patient's satisfaction.     [name]

## 2021-11-24 NOTE — Op Note (Signed)
OPERATIVE REPORT   11/24/2021  8:55 PM  PATIENT:  Jeff Ross   SURGEON:  Jonette Pesa, MD  ASSISTANT: Staff.   PREOPERATIVE DIAGNOSIS: Septic prepatellar bursitis right knee.  POSTOPERATIVE DIAGNOSIS:  Same.  PROCEDURE:  1.  Incision and drainage right lower extremity abscess. 2.  Excisional debridement of nonviable bursal tissue.  ANESTHESIA:   GETA.  ANTIBIOTICS: Patient is on scheduled IV antibiotics.  IMPLANTS: None.  SPECIMENS: Right knee prepatellar bursa for tissue culture.  COMPLICATIONS: None.  DISPOSITION: Stable to PACU.  SURGICAL INDICATIONS:  Jeff Ross is a 41 y.o. adult with a diagnosis of septic prepatellar bursitis right knee.  Bedside aspiration was performed yesterday, and culture is growing Staphylococcus aureus.  Susceptibilities are pending.  She was indicated for incision and drainage, debridement of her septic prepatellar bursitis.  The risks, benefits, and alternatives were discussed with the patient preoperatively including but not limited to the risks of infection, bleeding, nerve / blood vessel injury, cardiopulmonary complications, the need for repeat surgery, among others, and the patient was willing to proceed.  PROCEDURE IN DETAIL: The patient was identified in the holding area using 2 identifiers.  The surgical site was marked by myself.  Patient was taken to the operating room, placed supine on the operative table.  General anesthesia was induced.  All bony prominences were well-padded.  The right lower extremity was prepped and draped in the normal sterile surgical fashion.  Timeout was called, verifying site and site of surgery.  Patient is already receiving scheduled IV antibiotics.  I began by examining the right knee.  Significant erythema and fluctuance over the prepatellar bursa.  I made a longitudinal incision over the area of fluctuance, which essentially extended from the inferior pole of the patella to the superior aspect  of the tibial tubercle.  Upon incising the skin.  Copious amount of grossly purulent fluid was encountered.  Using a rongeur, I excisionally debrided all nonviable bursal tissue.  A representative sample was sent for tissue culture.  Using digital dissection, I ensured that there was no further loculations.  There was no extension of the infection superior to the patella or inferior to the tibial tubercle.  There was tunneling medially approximately 3 cm.  Once I was satisfied with the debridement, the wound was then irrigated with 3 L of saline using pulsatile lavage.  I placed 1/2 inch Penrose drain into the dependent area medially.  The wound was then reapproximated loosely with 2-0 nylon sutures.  Dressing was placed with Adaptic, 4 x 4's, ABDs, cast padding and Ace wrap.  The patient was then awakened from anesthesia and transferred to the PACU in stable condition.  Sponge, needle, and instrument counts were correct at the end of the case x2.  There were no known complications.  POSTOPERATIVE PLAN: Postoperatively, patient will be readmitted to the medical service.  Continue broad-spectrum IV antibiotics, and tailor appropriately when susceptibilities are available.  Plan for first dressing change Wednesday morning, and the Penrose drain will be removed.  Mobilize out of bed with PT.  Follow-up in the office 2 weeks after discharge for routine evaluation.

## 2021-11-24 NOTE — TOC Initial Note (Addendum)
Transition of Care [facility]) - Initial/Assessment Note    Patient Details  Name: Jeff Ross MRN: [id number] Date of Birth: 01-09-1981  [facility] CM/SW Contact:    Tom-Johnson, Hershal Coria, RN Phone Number: 11/24/2021, 12:53 PM  Clinical Narrative:                  CM spoke with patient at bedside about needs for post hospital transition. Admitted for Rt leg Cellulitis. Has scheduled I&D today 11/24/21. On IV abx. From home alone. Does not have children. Mother and one brother supportive with care. Currently not employed and not on disability. Does not have DME's at home. Does not drive, uses public transportation. States she does not have a PCP. Goes to ID clinic. Uses [facility] pharmacy on [address].  Patient requested a letter for her Lawyer and the DA stating he is in the hospital for his upcoming Court date. MD wrote letter and CM faxed to DA [phone] and [facility] Office [phone] with successful notice received. CM will continue to follow with needs as patient progresses with care.   Expected Discharge Plan: Home/Self Care Barriers to Discharge: Continued Medical Work up   Patient Goals and CMS Choice Patient states their goals for this hospitalization and ongoing recovery are:: To return home CMS Medicare.gov Compare Post Acute Care list provided to:: Patient Choice offered to / list presented to : NA  Expected Discharge Plan and Services Expected Discharge Plan: Home/Self Care   Discharge Planning Services: CM Consult Post Acute Care Choice: NA Living arrangements for the past 2 months: Single Family Home                                      Prior Living Arrangements/Services Living arrangements for the past 2 months: Single Family Home Lives with:: Self Patient language and need for interpreter reviewed:: Yes Do you feel safe going back to the place where you live?: Yes      Need for Family Participation in Patient Care: Yes  (Comment) Care giver support system in place?: Yes (comment)   Criminal Activity/Legal Involvement Pertinent to Current Situation/Hospitalization: No - Comment as needed  Activities of Daily Living Home Assistive Devices/Equipment: None ADL Screening (condition at time of admission) Patient's cognitive ability adequate to safely complete daily activities?: Yes Is the patient deaf or have difficulty hearing?: No Does the patient have difficulty seeing, even when wearing glasses/contacts?: No Does the patient have difficulty concentrating, remembering, or making decisions?: No Patient able to express need for assistance with ADLs?: Yes Does the patient have difficulty dressing or bathing?: No Independently performs ADLs?: Yes (appropriate for developmental age) Does the patient have difficulty walking or climbing stairs?: Yes Weakness of Legs: Right Weakness of Arms/Hands: None  Permission Sought/Granted Permission sought to share information with : Case Manager, Family Supports Permission granted to share information with : Yes, Verbal Permission Granted              Emotional Assessment Appearance:: Appears stated age Attitude/Demeanor/Rapport: Engaged, Gracious Affect (typically observed): Accepting, Appropriate, Calm, Hopeful, Pleasant Orientation: : Oriented to Self, Oriented to Place, Oriented to  Time, Oriented to Situation Alcohol / Substance Use: Not Applicable Psych Involvement: No (comment)  Admission diagnosis:  Cellulitis of right leg [L03.115] Septic prepatellar bursitis of right knee [M71.161] Patient Active Problem List   Diagnosis Date Noted   Septic prepatellar bursitis of right  knee 11/24/2021   Cellulitis of right leg 11/23/2021   Septic prepatellar bursitis 11/23/2021   Rash and nonspecific skin eruption 07/21/2021   Mixed anxiety depressive disorder 04/03/2018   Male-to-male transgender person 03/14/2012   ONYCHOMYCOSIS, 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

## 2021-11-24 NOTE — Transfer of Care (Signed)
Immediate Anesthesia Transfer of Care Note  Patient: Jeff Ross  Procedure(s) Performed: IRRIGATION AND DEBRIDEMENT Prepatellar Bursa (Right)  Patient Location: PACU  Anesthesia Type:General  Level of Consciousness: awake, alert  and oriented  Airway & Oxygen Therapy: Patient Spontanous Breathing  Post-op Assessment: Report given to RN and Post -op Vital signs reviewed and stable  Post vital signs: Reviewed and stable  Last Vitals:  Vitals Value Taken Time  BP 111/82 11/24/21 2102  Temp    Pulse 74 11/24/21 2104  Resp 17 11/24/21 2104  SpO2 99 % 11/24/21 2104  Vitals shown include unvalidated device data.  Last Pain:  Vitals:   11/24/21 1734  TempSrc: Oral  PainSc:          Complications: No notable events documented.
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
## 2021-11-24 NOTE — Anesthesia Procedure Notes (Signed)
Procedure Name: LMA Insertion Date/Time: 11/24/2021 8:14 PM  Performed by: Gwenyth Allegra, CRNAPre-anesthesia Checklist: Patient identified, Emergency Drugs available, Suction available, Patient being monitored and Timeout performed Patient Re-evaluated:Patient Re-evaluated prior to induction Preoxygenation: Pre-oxygenation with 100% oxygen LMA Size: 4.0 Number of attempts: 1 Placement Confirmation: positive ETCO2 and breath sounds checked- equal and bilateral Tube secured with: Tape Dental Injury: Teeth and Oropharynx as per pre-operative assessment

## 2021-11-25 ENCOUNTER — Encounter (HOSPITAL_COMMUNITY): Payer: Self-pay | Admitting: Orthopedic Surgery

## 2021-11-25 LAB — CBC WITH DIFFERENTIAL/PLATELET
Abs Immature Granulocytes: 0.03 10*3/uL (ref 0.00–0.07)
Basophils Absolute: 0 10*3/uL (ref 0.0–0.1)
Basophils Relative: 1 %
Eosinophils Absolute: 0.2 10*3/uL (ref 0.0–0.5)
Eosinophils Relative: 3 %
HCT: 27.8 % — ABNORMAL LOW (ref 39.0–52.0)
Hemoglobin: 9.1 g/dL — ABNORMAL LOW (ref 13.0–17.0)
Immature Granulocytes: 0 %
Lymphocytes Relative: 20 %
Lymphs Abs: 1.3 10*3/uL (ref 0.7–4.0)
MCH: 33.7 pg (ref 26.0–34.0)
MCHC: 32.7 g/dL (ref 30.0–36.0)
MCV: 103 fL — ABNORMAL HIGH (ref 80.0–100.0)
Monocytes Absolute: 0.7 10*3/uL (ref 0.1–1.0)
Monocytes Relative: 10 %
Neutro Abs: 4.4 10*3/uL (ref 1.7–7.7)
Neutrophils Relative %: 66 %
Platelets: 316 10*3/uL (ref 150–400)
RBC: 2.7 MIL/uL — ABNORMAL LOW (ref 4.22–5.81)
RDW: 11.6 % (ref 11.5–15.5)
WBC: 6.7 10*3/uL (ref 4.0–10.5)
nRBC: 0 % (ref 0.0–0.2)

## 2021-11-25 LAB — BASIC METABOLIC PANEL
Anion gap: 8 (ref 5–15)
BUN: 13 mg/dL (ref 6–20)
CO2: 25 mmol/L (ref 22–32)
Calcium: 8 mg/dL — ABNORMAL LOW (ref 8.9–10.3)
Chloride: 102 mmol/L (ref 98–111)
Creatinine, Ser: 1.06 mg/dL (ref 0.61–1.24)
GFR, Estimated: >60 mL/min (ref >60)
Glucose, Bld: 102 mg/dL — ABNORMAL HIGH (ref 70–99)
Potassium: 3.6 mmol/L (ref 3.5–5.1)
Sodium: 135 mmol/L (ref 135–145)

## 2021-11-25 MED ORDER — POLYETHYLENE GLYCOL 3350 17 G PO PACK: 17.0000 g | PACK | Freq: Every day | ORAL | Status: DC | PRN

## 2021-11-25 MED ORDER — ENOXAPARIN SODIUM 40 MG/0.4ML IJ SOSY
40.0000 mg | PREFILLED_SYRINGE | INTRAMUSCULAR | Status: DC
  Administered 2021-11-25: 40 mg via SUBCUTANEOUS
  Filled 2021-11-25: qty 0.4

## 2021-11-25 MED ORDER — SENNA 8.6 MG PO TABS
1.0000 | ORAL_TABLET | Freq: Two times a day (BID) | ORAL | Status: DC
  Administered 2021-11-25: 8.6 mg via ORAL
  Filled 2021-11-25 (×3): qty 1

## 2021-11-25 MED ORDER — ONDANSETRON HCL 4 MG PO TABS: 4.0000 mg | ORAL_TABLET | Freq: Four times a day (QID) | ORAL | Status: DC | PRN

## 2021-11-25 MED ORDER — HYDROCODONE-ACETAMINOPHEN 5-325 MG PO TABS
1.0000 | ORAL_TABLET | ORAL | Status: DC | PRN
  Administered 2021-11-25 – 2021-11-26 (×3): 1 via ORAL
  Filled 2021-11-25 (×3): qty 1

## 2021-11-25 MED ORDER — DOCUSATE SODIUM 100 MG PO CAPS
100.0000 mg | ORAL_CAPSULE | Freq: Two times a day (BID) | ORAL | Status: DC
  Administered 2021-11-25: 100 mg via ORAL
  Filled 2021-11-25 (×2): qty 1

## 2021-11-25 MED ORDER — METOCLOPRAMIDE HCL 5 MG/ML IJ SOLN: 5.0000 mg | Freq: Three times a day (TID) | INTRAMUSCULAR | Status: DC | PRN

## 2021-11-25 MED ORDER — METOCLOPRAMIDE HCL 5 MG PO TABS: 5.0000 mg | ORAL_TABLET | Freq: Three times a day (TID) | ORAL | Status: DC | PRN

## 2021-11-25 MED ORDER — ONDANSETRON HCL 4 MG/2ML IJ SOLN: 4.0000 mg | Freq: Four times a day (QID) | INTRAMUSCULAR | Status: DC | PRN

## 2021-11-25 MED ORDER — HYDROCODONE-ACETAMINOPHEN 7.5-325 MG PO TABS: 1.0000 | ORAL_TABLET | ORAL | Status: DC | PRN

## 2021-11-25 MED ORDER — ACETAMINOPHEN 325 MG PO TABS: 325.0000 mg | ORAL_TABLET | Freq: Four times a day (QID) | ORAL | Status: DC | PRN

## 2021-11-25 NOTE — Progress Notes (Signed)
HD#1 Subjective:   Summary: [name] is a 41 y.o. male-to-male transgender person with PMH of HIV with undetectable viral load who presents with right knee pain and infection and admitted for cellulitis and septic pre-patellar bursitis.   Overnight Events: none  She is feeling well this morning.  Has some knee pain but much improvement since admission. Able to tolerate food last night after surgery. Her BP was low this morning, but asymptomatic. Reports adequate fluid intake.  Objective:  Vital signs in last 24 hours: Vitals:   11/25/21 0621 11/25/21 0624 11/25/21 0630 11/25/21 0825  BP: (!) 89/57 (!) 79/43 (!) 96/45 (!) 94/56  Pulse: 71 67 61 71  Resp:   16   Temp:   97.6 F (36.4 C)   TempSrc:   Oral   SpO2:   100%   Weight:      Height:       Supplemental O2: Room Air SpO2: 100 %   Physical Exam:  Constitutional: alert, well-appearing, in no acute distress HENT: normocephalic atraumatic Neck: supple Cardiovascular: regular rate and rhythm, no m/r/g Pulmonary: normal work of breathing MSK: normal bulk and tone Neurological: alert & oriented x 3 Skin: decreased erythema over right thigh, right knee and lower leg bandaged  Psych: normal mood and behavior  Filed Weights   11/23/21 0850  Weight: 81.6 kg     Intake/Output Summary (Last 24 hours) at 11/25/2021 1317 Last data filed at 11/25/2021 0847 Gross per 24 hour  Intake 2594.1 ml  Output 0 ml  Net 2594.1 ml   Net IO Since Admission: 2,839.61 mL [11/25/21 1317]  Pertinent Labs:    Latest Ref Rng & Units 11/25/2021    8:34 AM 11/24/2021    8:05 AM 11/23/2021    9:12 AM  CBC  WBC 4.0 - 10.5 K/uL 6.7  9.3  17.7   Hemoglobin 13.0 - 17.0 g/dL 9.1  41.9  37.9   Hematocrit 39.0 - 52.0 % 27.8  30.1  37.5   Platelets 150 - 400 K/uL 316  311  345        Latest Ref Rng & Units 11/25/2021    8:34 AM 11/24/2021    8:05 AM 11/23/2021    9:12 AM  CMP  Glucose 70 - 99 mg/dL 024  98  097   BUN 6 - 20 mg/dL 13  11  18     Creatinine 0.61 - 1.24 mg/dL  3.53  2.99   Sodium 135 - 145 mmol/L 135  134  130   Potassium 3.5 - 5.1 mmol/L 3.6  4.1  3.3   Chloride 98 - 111 mmol/L 102  104  94   CO2 22 - 32 mmol/L 25  22  24    Calcium 8.9 - 10.3 mg/dL 8.0  8.3  9.1   Total Protein 6.5 - 8.1 g/dL   8.1   Total Bilirubin 0.3 - 1.2 mg/dL   0.9   Alkaline Phos 38 - 126 U/L   134   AST 15 - 41 U/L   42   ALT 0 - 44 U/L   54     Imaging: No results found.  Assessment/Plan:   Principal Problem:   Cellulitis of right leg Active Problems:   Septic prepatellar bursitis   Septic prepatellar bursitis of right knee   Patient Summary: Jeff Ross is a 41 y.o. with a pertinent PMH of HIV with undetectable viral load, who presented with right knee pain and  infection and admitted for cellulitis and septic pre-patellar bursitis.    Right LE cellulitis Septic prepatellar bursitis Postop Day 1. CT R knee showed subcutaneous edema with focal hypodense collection anterior to knee with no subcutaneous air. Edema extends into intramuscular septa, concern for necrotizing fasciitis. Ortho did irrigation and drainage of right knee yesterday, plans for dressing change and remove drain tomorrow. Cultures show MRSA sensitive to clindamycin, gentamicin, rifampin, vancomycin. If she continues to clinically improve, we will transition to p.o. antibiotics for total 14-day course. -continue vancomycin, day 3 -Stop zosyn and clindamycin  HIV Controlled with Biktarvy and Prezcobix. Follows [facility] from ID. Last viral load <20 on 7/3. CD4 count was 514 on 4/3. Continue home Biktarvy and Prezcobix.   Hypokalemia Hyponatremia Potassium 3.6 today. Gave IV potassium yesterday and was up to 4.1. Magnesium is 1.8. Sodium stable 135. 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   Diet: Normal IVF:  None,None VTE: Enoxaparin Code: Full PT/OT recs: None, none.   Dispo: Anticipate discharge to Home pending medically stable.  Rana Snare, DO Internal Medicine Resident PGY-1 Please contact the on call pager after 5 pm and on weekends at [phone].

## 2021-11-25 NOTE — Anesthesia Postprocedure Evaluation (Signed)
Anesthesia Post Note  Patient: Jeff Ross  Procedure(s) Performed: IRRIGATION AND DEBRIDEMENT Prepatellar Bursa (Right)     Patient location during evaluation: PACU Anesthesia Type: General Level of consciousness: awake Pain management: pain level controlled Vital Signs Assessment: post-procedure vital signs reviewed and stable Respiratory status: spontaneous breathing Cardiovascular status: stable Postop Assessment: no apparent nausea or vomiting Anesthetic complications: no   No notable events documented.  Last Vitals:  Vitals:   11/24/21 2145 11/24/21 2208  BP: 95/74 111/69  Pulse: 77 77  Resp: 14 16  Temp: 37.3 C 36.5 C  SpO2: 98% 100%    Last Pain:  Vitals:   11/24/21 2301  TempSrc:   PainSc: Asleep                 Henrick Mcgue

## 2021-11-25 NOTE — Evaluation (Signed)
Physical Therapy Evaluation Patient Details Name: Jeff Ross MRN: [id number] DOB: 09-26-1980 Today's Date: 11/25/2021  History of Present Illness  41yo male-to-male who presented 11/23/21 with worsening right knee pain and swelling. Ortho consult +Septic prepatellar bursitis of right knee; 8/06 aspiration of bursa; 8/7 I&D rt knee  PMH of HIV and mixed anxiety depressive disorder  Clinical Impression   Patient is s/p above surgery resulting in functional limitations due to the deficits listed below (see PT Problem List). Patient was able to ambulate with crutches modified independent and completed stair training with minguard assist. Educated on ROM for rt knee, but could benefit from an additional PT session to address ROM and provide handout.  Patient will benefit from skilled PT to increase their independence and safety with mobility to allow discharge to the venue listed below.          Recommendations for follow up therapy are one component of a multi-disciplinary discharge planning process, led by the attending physician.  Recommendations may be updated based on patient status, additional functional criteria and insurance authorization.  Follow Up Recommendations No PT follow up      Assistance Recommended at Discharge None  Patient can return home with the following       Equipment Recommendations Crutches  Recommendations for Other Services       Functional Status Assessment Patient has had a recent decline in their functional status and demonstrates the ability to make significant improvements in function in a reasonable and predictable amount of time.     Precautions / Restrictions Precautions Precautions: Fall Restrictions Weight Bearing Restrictions: Yes RLE Weight Bearing: Weight bearing as tolerated      Mobility  Bed Mobility Overal bed mobility: Independent                  Transfers Overall transfer level: Independent Equipment used: None                     Ambulation/Gait Ambulation/Gait assistance: Modified independent (Device/Increase time), Supervision Gait Distance (Feet): 100 Feet Assistive device: Crutches Gait Pattern/deviations: Step-to pattern, Step-through pattern, Decreased weight shift to right Gait velocity: decr appropriately     General Gait Details: cues for sequencing; cues for foot flat and wt-bearing on RLE  Stairs Stairs: Yes Stairs assistance: Min guard Stair Management: One rail Right, With crutches, Step to pattern Number of Stairs: 3 (limited by IV) General stair comments: vc for initial 2 steps and no cues for 3rd  Wheelchair Mobility    Modified Rankin (Stroke Patients Only)       Balance Overall balance assessment: Modified Independent                                           Pertinent Vitals/Pain Pain Assessment Pain Assessment: 0-10 Pain Score: 7  Pain Location: rt knee Pain Descriptors / Indicators: Grimacing, Guarding, Operative site guarding Pain Intervention(s): Limited activity within patient's tolerance, Monitored during session, Repositioned, Patient requesting pain meds-RN notified    Home Living Family/patient expects to be discharged to:: Private residence Living Arrangements: Alone   Type of Home: House (townhouse)       Alternate Level Stairs-Number of Steps: flight Home Layout: Two level;Bed/bath upstairs Home Equipment: None      Prior Function Prior Level of Function : Independent/Modified Independent  Hand Dominance        Extremity/Trunk Assessment   Upper Extremity Assessment Upper Extremity Assessment: Overall WFL for tasks assessed    Lower Extremity Assessment Lower Extremity Assessment: RLE deficits/detail RLE Deficits / Details: post-op bandage; knee flexion 0-80    Cervical / Trunk Assessment Cervical / Trunk Assessment: Normal  Communication   Communication: No difficulties   Cognition Arousal/Alertness: Awake/alert Behavior During Therapy: WFL for tasks assessed/performed Overall Cognitive Status: Within Functional Limits for tasks assessed                                          General Comments      Exercises General Exercises - Lower Extremity Ankle Circles/Pumps: AROM, Right, 10 reps Heel Slides: AROM, Right, 5 reps   Assessment/Plan    PT Assessment Patient needs continued PT services  PT Problem List Decreased range of motion;Decreased mobility;Decreased knowledge of use of DME;Pain       PT Treatment Interventions DME instruction;Gait training;Stair training;Functional mobility training;Therapeutic activities;Therapeutic exercise;Patient/family education    PT Goals (Current goals can be found in the Care Plan section)  Acute Rehab PT Goals Patient Stated Goal: be independent with crutches PT Goal Formulation: With patient Time For Goal Achievement: 12/09/21 Potential to Achieve Goals: Good    Frequency Min 5X/week     Co-evaluation               AM-PAC PT "6 Clicks" Mobility  Outcome Measure Help needed turning from your back to your side while in a flat bed without using bedrails?: None Help needed moving from lying on your back to sitting on the side of a flat bed without using bedrails?: None Help needed moving to and from a bed to a chair (including a wheelchair)?: A Little Help needed standing up from a chair using your arms (e.g., wheelchair or bedside chair)?: None Help needed to walk in hospital room?: None Help needed climbing 3-5 steps with a railing? : A Little 6 Click Score: 22    End of Session   Activity Tolerance: Patient tolerated treatment well Patient left: in bed;with call bell/phone within reach   PT Visit Diagnosis: Other abnormalities of gait and mobility (R26.89)    Time: 6283-1517 PT Time Calculation (min) (ACUTE ONLY): 36 min   Charges:   PT Evaluation $PT Eval Low  Complexity: 1 Low PT Treatments $Gait Training: 8-22 mins         Jeff Ross, PT Acute Rehabilitation Services  Office [phone]   Jeff Ross 11/25/2021, 4:36 PM

## 2021-11-25 NOTE — Progress Notes (Signed)
Subjective:  Patient reports pain as mild.  Denies N/V/CP/SOB/Abd pain/Dizziness. Patient reports the pain in the knee has much improved. Reports feeling good this morning. Denies tingling and numbness in LE bilaterally. No fever or chills.   Objective:   VITALS:   Vitals:   11/24/21 2115 11/24/21 2130 11/24/21 2145 11/24/21 2208  BP: 108/80 (!) 116/94 95/74 111/69  Pulse: 86 67 77 77  Resp: 19 14 14 16   Temp:   99.1 F (37.3 C) 97.7 F (36.5 C)  TempSrc:    Oral  SpO2: 98% 94% 98% 100%  Weight:      Height:        Patient sitting up in bed eating breakfast. NAD.  Neurologically intact ABD soft Neurovascular intact Sensation intact distally Intact pulses distally Dorsiflexion/Plantar flexion intact No cellulitis present Compartment soft Dressing C/D/I.  Penrose drain in place.  Erythema in right leg is improving. Mild swelling in right lower leg.   Lab Results  Component Value Date   WBC 9.3 11/24/2021   HGB 10.1 (L) 11/24/2021   HCT 30.1 (L) 11/24/2021   MCV 100.3 (H) 11/24/2021   PLT 311 11/24/2021   BMET    Component Value Date/Time   NA 134 (L) 11/24/2021 0805   K 4.1 11/24/2021 0805   CL 104 11/24/2021 0805   CO2 22 11/24/2021 0805   GLUCOSE 98 11/24/2021 0805   BUN 11 11/24/2021 0805   CREATININE 0.92 11/24/2021 0805   CREATININE 0.95 05/29/2021 0428   CALCIUM 8.3 (L) 11/24/2021 0805   GFRNONAA >60 11/24/2021 0805   GFRNONAA 98 05/31/2018 1113   Results for orders placed or performed during the hospital encounter of 11/23/21  Blood Culture (routine x 2)     Status: None (Preliminary result)   Collection Time: 11/23/21  9:12 AM   Specimen: BLOOD RIGHT ARM  Result Value Ref Range Status   Specimen Description BLOOD RIGHT ARM  Final   Special Requests   Final    BOTTLES DRAWN AEROBIC AND ANAEROBIC Blood Culture results may not be optimal due to an excessive volume of blood received in culture bottles   Culture   Final    NO GROWTH < 24  HOURS Performed at [facility], [address], [place], [place]    Report Status PENDING  Incomplete  Blood Culture (routine x 2)     Status: None (Preliminary result)   Collection Time: 11/23/21  9:33 AM   Specimen: BLOOD LEFT ARM  Result Value Ref Range Status   Specimen Description BLOOD LEFT ARM  Final   Special Requests   Final    BOTTLES DRAWN AEROBIC AND ANAEROBIC Blood Culture adequate volume   Culture   Final    NO GROWTH < 24 HOURS Performed at [facility], [address], [place], [place]    Report Status PENDING  Incomplete  Aerobic/Anaerobic Culture w Gram Stain (surgical/deep wound)     Status: None (Preliminary result)   Collection Time: 11/23/21  4:52 PM   Specimen: Bursa/Synovial Cyst; Abscess  Result Value Ref Range Status   Specimen Description Bursa  Final   Special Requests NONE  Final   Gram Stain   Final    ABUNDANT WBC PRESENT,BOTH PMN AND MONONUCLEAR FEW GRAM POSITIVE COCCI IN CLUSTERS    Culture   Final    MODERATE STAPHYLOCOCCUS AUREUS SUSCEPTIBILITIES TO FOLLOW Performed at [facility], [address], [place],  [place] [address]    Report Status PENDING  Incomplete  Surgical pcr screen     Status: Abnormal   Collection Time: 11/24/21  6:43 AM   Specimen: Nasal Mucosa; Nasal Swab  Result Value Ref Range Status   MRSA, PCR POSITIVE (A) NEGATIVE Final   Staphylococcus aureus POSITIVE (A) NEGATIVE Final    Comment: RESULT CALLED TO, READ BACK BY AND VERIFIED WITH: RN A.EVENS AT 1035 ON 11/24/2021 BY T.SAAD. (NOTE) The Xpert SA Assay (FDA approved for NASAL specimens in patients 80 years of age and older), is one component of a comprehensive surveillance program. It is not intended to diagnose infection nor to guide or monitor treatment. Performed at [facility], [address], [place], [place] [address]   Aerobic/Anaerobic Culture w Gram Stain (surgical/deep wound)     Status: None (Preliminary  result)   Collection Time: 11/24/21  8:51 PM   Specimen: PATH Benign ortho; Tissue  Result Value Ref Range Status   Specimen Description TISSUE RIGHT KNEE  Final   Special Requests PRE PATELLAR BURSA TISSUE  Final   Gram Stain   Final    RARE WBC PRESENT,BOTH PMN AND MONONUCLEAR RARE GRAM POSITIVE COCCI IN CLUSTERS RARE GRAM POSITIVE COCCI IN SINGLES Performed at [facility], [address] [place], [place] [address]    Culture PENDING  Incomplete   Report Status PENDING  Incomplete     Assessment/Plan: 1 Day Post-Op   Principal Problem:   Cellulitis of right leg Active Problems:   Septic prepatellar bursitis   Septic prepatellar bursitis of right knee   Aspiration of prepatellar bursa 11/23/21 culture is growing staphylococcus aureus, susceptibilities pending. Continue to follow.  Intraoperative tissue culture and susceptibilities 11/24/21 pending. Continue to follow. Will tailor antibiotics pending results.   Patient is on broad spectrum antibiotics clindamycin and vancomycin.   WBAT with walker PO pain control PT/OT: Patient to ambulate with PT today.  Dispo: Patient under care of medical team, disposition per their recommendation. Continue broad-spectrum IV antibiotics, and tailor appropriately when susceptibilities are available.  Plan for first dressing change Wednesday morning, and the Penrose drain will be removed.  Mobilize out of bed with PT.  Follow-up in the office 2 weeks after discharge for routine evaluation.    Clois Dupes, PA-C 11/25/2021, 7:30 AM   [facility]  [place] [address], [place], [place] [address]

## 2021-11-26 LAB — CBC WITH DIFFERENTIAL/PLATELET
Abs Immature Granulocytes: 0.03 10*3/uL (ref 0.00–0.07)
Basophils Absolute: 0.1 10*3/uL (ref 0.0–0.1)
Basophils Relative: 1 %
Eosinophils Absolute: 0.4 10*3/uL (ref 0.0–0.5)
Eosinophils Relative: 7 %
HCT: 29.9 % — ABNORMAL LOW (ref 39.0–52.0)
Hemoglobin: 9.7 g/dL — ABNORMAL LOW (ref 13.0–17.0)
Immature Granulocytes: 1 %
Lymphocytes Relative: 29 %
Lymphs Abs: 1.6 10*3/uL (ref 0.7–4.0)
MCH: 33 pg (ref 26.0–34.0)
MCHC: 32.4 g/dL (ref 30.0–36.0)
MCV: 101.7 fL — ABNORMAL HIGH (ref 80.0–100.0)
Monocytes Absolute: 0.6 10*3/uL (ref 0.1–1.0)
Monocytes Relative: 11 %
Neutro Abs: 2.9 10*3/uL (ref 1.7–7.7)
Neutrophils Relative %: 51 %
Platelets: 320 10*3/uL (ref 150–400)
RBC: 2.94 MIL/uL — ABNORMAL LOW (ref 4.22–5.81)
RDW: 11.6 % (ref 11.5–15.5)
WBC: 5.6 10*3/uL (ref 4.0–10.5)
nRBC: 0 % (ref 0.0–0.2)

## 2021-11-26 LAB — BASIC METABOLIC PANEL
Anion gap: 7 (ref 5–15)
BUN: 8 mg/dL (ref 6–20)
CO2: 25 mmol/L (ref 22–32)
Calcium: 8 mg/dL — ABNORMAL LOW (ref 8.9–10.3)
Chloride: 103 mmol/L (ref 98–111)
Creatinine, Ser: 0.84 mg/dL (ref 0.61–1.24)
GFR, Estimated: >60 mL/min (ref >60)
Glucose, Bld: 112 mg/dL — ABNORMAL HIGH (ref 70–99)
Potassium: 3.8 mmol/L (ref 3.5–5.1)
Sodium: 135 mmol/L (ref 135–145)

## 2021-11-26 LAB — VITAMIN D 25 HYDROXY (VIT D DEFICIENCY, FRACTURES): Vit D, 25-Hydroxy: 21.29 ng/mL — ABNORMAL LOW (ref 30–100)

## 2021-11-26 LAB — ALBUMIN: Albumin: 2.5 g/dL — ABNORMAL LOW (ref 3.5–5.0)

## 2021-11-26 MED ORDER — ORAL CARE MOUTH RINSE: 15.0000 mL | OROMUCOSAL | Status: DC | PRN

## 2021-11-26 MED ORDER — LINEZOLID 600 MG PO TABS
600.0000 mg | ORAL_TABLET | Freq: Two times a day (BID) | ORAL | 0 refills | Status: AC
Start: 2021-11-26 — End: 2021-12-20

## 2021-11-26 NOTE — Progress Notes (Signed)
Subjective:  Patient reports pain as mild to none in the right knee.  Denies N/V/CP/SOB/Abd pain. No fever or chills. Denies tingling and numbness in LE bilaterally. Patient states that the swelling has improved and is feeling better than yesterday.   Objective:   VITALS:   Vitals:   11/25/21 0825 11/25/21 1701 11/25/21 2030 11/26/21 0405  BP: (!) 94/56 (!) 94/56 103/64 (!) 95/55  Pulse: 71 71 62 61  Resp:  18 20 18   Temp:  98.5 F (36.9 C) (!) 97.5 F (36.4 C) 98.3 F (36.8 C)  TempSrc:   Oral Oral  SpO2:  100% 100% 94%  Weight:      Height:        Patient sitting up in bed having breakfast. NAD.  Neurologically intact ABD soft Neurovascular intact Sensation intact distally Intact pulses distally Dorsiflexion/Plantar flexion intact No cellulitis present Compartment soft Erythema and swelling continued to improved. Dressing and penrose drain removed today. Incision healing well. Adaptic, dry 4x4, ABD pad and ace bandage applied to knee today.   Lab Results  Component Value Date   WBC 5.6 11/26/2021   HGB 9.7 (L) 11/26/2021   HCT 29.9 (L) 11/26/2021   MCV 101.7 (H) 11/26/2021   PLT 320 11/26/2021   BMET    Component Value Date/Time   NA 135 11/26/2021 0549   K 3.8 11/26/2021 0549   CL 103 11/26/2021 0549   CO2 25 11/26/2021 0549   GLUCOSE 112 (H) 11/26/2021 0549   BUN 8 11/26/2021 0549   CREATININE 0.84 11/26/2021 0549   CREATININE 0.95 05/29/2021 0428   CALCIUM 8.0 (L) 11/26/2021 0549   GFRNONAA >60 11/26/2021 0549   GFRNONAA 98 05/31/2018 1113   Results for orders placed or performed during the hospital encounter of 11/23/21  Blood Culture (routine x 2)     Status: None (Preliminary result)   Collection Time: 11/23/21  9:12 AM   Specimen: BLOOD RIGHT ARM  Result Value Ref Range Status   Specimen Description BLOOD RIGHT ARM  Final   Special Requests   Final    BOTTLES DRAWN AEROBIC AND ANAEROBIC Blood Culture results may not be optimal due to an  excessive volume of blood received in culture bottles   Culture   Final    NO GROWTH 3 DAYS Performed at [facility], [address] [place], [place]    Report Status PENDING  Incomplete  Blood Culture (routine x 2)     Status: None (Preliminary result)   Collection Time: 11/23/21  9:33 AM   Specimen: BLOOD LEFT ARM  Result Value Ref Range Status   Specimen Description BLOOD LEFT ARM  Final   Special Requests   Final    BOTTLES DRAWN AEROBIC AND ANAEROBIC Blood Culture adequate volume   Culture   Final    NO GROWTH 3 DAYS Performed at [facility], [address] [place], [place]    Report Status PENDING  Incomplete  Aerobic/Anaerobic Culture w Gram Stain (surgical/deep wound)     Status: None (Preliminary result)   Collection Time: 11/23/21  4:52 PM   Specimen: Bursa/Synovial Cyst; Abscess  Result Value Ref Range Status   Specimen Description Bursa  Final   Special Requests NONE  Final   Gram Stain   Final    ABUNDANT WBC PRESENT,BOTH PMN AND MONONUCLEAR FEW GRAM POSITIVE COCCI IN CLUSTERS Performed at [facility], [address] [place], [place]  [address]    Culture   Final    MODERATE METHICILLIN RESISTANT STAPHYLOCOCCUS AUREUS NO ANAEROBES ISOLATED; CULTURE IN PROGRESS FOR 5 DAYS    Report Status PENDING  Incomplete   Organism ID, Bacteria METHICILLIN RESISTANT STAPHYLOCOCCUS AUREUS  Final      Susceptibility   Methicillin resistant staphylococcus aureus - MIC*    CIPROFLOXACIN >=8 RESISTANT Resistant     ERYTHROMYCIN >=8 RESISTANT Resistant     GENTAMICIN <=0.5 SENSITIVE Sensitive     OXACILLIN >=4 RESISTANT Resistant     TETRACYCLINE >=16 RESISTANT Resistant     VANCOMYCIN 1 SENSITIVE Sensitive     TRIMETH/SULFA >=320 RESISTANT Resistant     CLINDAMYCIN <=0.25 SENSITIVE Sensitive     RIFAMPIN <=0.5 SENSITIVE Sensitive     Inducible Clindamycin NEGATIVE Sensitive     * MODERATE METHICILLIN RESISTANT STAPHYLOCOCCUS AUREUS   Surgical pcr screen     Status: Abnormal   Collection Time: 11/24/21  6:43 AM   Specimen: Nasal Mucosa; Nasal Swab  Result Value Ref Range Status   MRSA, PCR POSITIVE (A) NEGATIVE Final   Staphylococcus aureus POSITIVE (A) NEGATIVE Final    Comment: RESULT CALLED TO, READ BACK BY AND VERIFIED WITH: RN A.EVENS AT 1035 ON 11/24/2021 BY T.SAAD. (NOTE) The Xpert SA Assay (FDA approved for NASAL specimens in patients 65 years of age and older), is one component of a comprehensive surveillance program. It is not intended to diagnose infection nor to guide or monitor treatment. Performed at [facility], [address], [place], [place] [address]   Aerobic/Anaerobic Culture w Gram Stain (surgical/deep wound)     Status: None (Preliminary result)   Collection Time: 11/24/21  8:51 PM   Specimen: PATH Benign ortho; Tissue  Result Value Ref Range Status   Specimen Description TISSUE RIGHT KNEE  Final   Special Requests PRE PATELLAR BURSA TISSUE  Final   Gram Stain   Final    RARE WBC PRESENT,BOTH PMN AND MONONUCLEAR RARE GRAM POSITIVE COCCI IN CLUSTERS RARE GRAM POSITIVE COCCI IN SINGLES    Culture   Final    RARE STAPHYLOCOCCUS AUREUS SUSCEPTIBILITIES TO FOLLOW Performed at [facility], [address], [place], [place] [address]    Report Status PENDING  Incomplete     Assessment/Plan: 2 Days Post-Op   Principal Problem:   Cellulitis of right leg Active Problems:   Septic prepatellar bursitis   Septic prepatellar bursitis of right knee  Aspiration of prepatellar bursa 11/23/21 culture Cultures show MRSA sensitive to clindamycin, gentamicin, rifampin, vancomycin. Continue IV vancomycin as primary therapy per medical team.    Intraoperative tissue culture 11/24/21 showing staphylococcus aureus. Susceptibilities still pending. Continue to follow. Will tailor antibiotics pending results.     WBAT with crutches DVT ppx: Lovenox, SCDs, TEDS PO pain control PT/OT:  Patient ambulated 100 feet with PT yesterday with crutches.  Dispo: Patient under care of medical team, disposition per their recommendation. Continue IV vancomycin as primary therapy per medical team. Intraoperative tissue susceptibilities still pending, will continue to tailor IV antibiotics appropriately when susceptibilities are available. Penrose drain was removed today and dry dressing placed.  Mobilize out of bed with PT.  Follow-up in the office 2 weeks after discharge for routine evaluation.   Clois Dupes, PA-C 11/26/2021, 7:53 AM   [facility]  [place] [address], [place], [place] [address]

## 2021-11-26 NOTE — Progress Notes (Signed)
Patient discharged to home, AVS reviewed, IV's removed, tele-box returned. Ortho tech provided crutches. NT assisted patient to exit via wheelchair, patient's friend provided transportation

## 2021-11-26 NOTE — Progress Notes (Signed)
Physical Therapy Treatment and Discharge Patient Details Name: Jeff Ross MRN: [id number] DOB: 07/29/1980 Today's Date: 11/26/2021   History of Present Illness 41yo male-to-male who presented 11/23/21 with worsening right knee pain and swelling. Ortho consult +Septic prepatellar bursitis of right knee; 8/06 aspiration of bursa; 8/7 I&D rt knee  PMH of HIV and mixed anxiety depressive disorder    PT Comments    Patient moving even better today. Able to demonstrate ROM exercises independently and modified independent with crutches for ambulation and stairs. No further PT needs identified and all goals met.    Recommendations for follow up therapy are one component of a multi-disciplinary discharge planning process, led by the attending physician.  Recommendations may be updated based on patient status, additional functional criteria and insurance authorization.  Follow Up Recommendations  No PT follow up     Assistance Recommended at Discharge None  Patient can return home with the following     Equipment Recommendations  Crutches    Recommendations for Other Services       Precautions / Restrictions Precautions Precautions: Fall Restrictions Weight Bearing Restrictions: Yes RLE Weight Bearing: Weight bearing as tolerated     Mobility  Bed Mobility Overal bed mobility: Independent                  Transfers Overall transfer level: Independent Equipment used: None                    Ambulation/Gait Ambulation/Gait assistance: Modified independent (Device/Increase time), Supervision Gait Distance (Feet): 100 Feet Assistive device: Crutches Gait Pattern/deviations: Step-to pattern, Step-through pattern, Decreased weight shift to right Gait velocity: decr appropriately     General Gait Details: cues for sequencing; cues for foot flat and wt-bearing on RLE   Stairs   Stairs assistance: Modified independent (Device/Increase time) Stair Management:  One rail Right, With crutches, Step to pattern Number of Stairs: 10 General stair comments: vc for initial 2 steps and no cues for remainder of steps   Wheelchair Mobility    Modified Rankin (Stroke Patients Only)       Balance Overall balance assessment: Modified Independent                                          Cognition Arousal/Alertness: Awake/alert Behavior During Therapy: WFL for tasks assessed/performed Overall Cognitive Status: Within Functional Limits for tasks assessed                                          Exercises General Exercises - Lower Extremity Ankle Circles/Pumps: AROM, Right, 10 reps Heel Slides: AROM, Right, 5 reps    General Comments        Pertinent Vitals/Pain Pain Assessment Pain Assessment: 0-10 Pain Score: 4  Pain Location: rt knee Pain Descriptors / Indicators: Grimacing, Guarding, Operative site guarding Pain Intervention(s): Limited activity within patient's tolerance    Home Living                          Prior Function            PT Goals (current goals can now be found in the care plan section) Acute Rehab PT Goals Patient Stated Goal: be independent with crutches PT Goal  Formulation: With patient Time For Goal Achievement: 12/09/21 Potential to Achieve Goals: Good Progress towards PT goals: Goals met/education completed, patient discharged from PT    Frequency    Min 5X/week      PT Plan Current plan remains appropriate    Co-evaluation              AM-PAC PT "6 Clicks" Mobility   Outcome Measure  Help needed turning from your back to your side while in a flat bed without using bedrails?: None Help needed moving from lying on your back to sitting on the side of a flat bed without using bedrails?: None Help needed moving to and from a bed to a chair (including a wheelchair)?: None Help needed standing up from a chair using your arms (e.g., wheelchair or  bedside chair)?: None Help needed to walk in hospital room?: None Help needed climbing 3-5 steps with a railing? : None 6 Click Score: 24    End of Session   Activity Tolerance: Patient tolerated treatment well Patient left: in bed;with call bell/phone within reach Nurse Communication: Mobility status;Other (comment) (crutches ordered) PT Visit Diagnosis: Other abnormalities of gait and mobility (R26.89)     Time: 8309-4076 PT Time Calculation (min) (ACUTE ONLY): 16 min  Charges:  $Gait Training: 8-22 mins                      [place]  Office [phone]    Rexanne Mano 11/26/2021, 9:54 AM

## 2021-11-26 NOTE — Discharge Summary (Addendum)
Name: Jeff Ross MRN: [id number] DOB: 1980-12-18 41 y.o. PCP: Patient, No Pcp Per  Date of Admission: 11/23/2021  8:41 AM Date of Discharge: 11/26/2021 Attending Physician: Inez Catalina, MD  Discharge Diagnosis: 1. Principal Problem:   Cellulitis of right leg Active Problems:   Septic prepatellar bursitis   Septic prepatellar bursitis of right knee   Discharge Medications: Allergies as of 11/26/2021   No Known Allergies      Medication List     STOP taking these medications    HYDROcodone-acetaminophen 5-325 MG tablet Commonly known as: NORCO/VICODIN       TAKE these medications    bictegravir-emtricitabine-tenofovir AF 50-200-25 MG Tabs tablet Commonly known as: BIKTARVY Take 1 tablet by mouth daily. What changed: when to take this   estradiol 0.1 MG/24HR patch Commonly known as: VIVELLE-DOT APPLY 2 PATCHES(0.2 MG) EXTERNALLY TO THE SKIN 2 TIMES A WEEK What changed:  how much to take how to take this when to take this additional instructions   linezolid 600 MG tablet Commonly known as: ZYVOX Take 1 tablet (600 mg total) by mouth 2 (two) times daily for 24 days.   methocarbamol 500 MG tablet Commonly known as: ROBAXIN Take 1 tablet (500 mg total) by mouth 3 (three) times daily for 7 days.   Prezcobix 800-150 MG tablet Generic drug: darunavir-cobicistat Take 1 tablet by mouth daily with breakfast. Swallow whole. Take with food. What changed: when to take this   spironolactone 100 MG tablet Commonly known as: ALDACTONE TAKE 1 TABLET(100 MG) BY MOUTH DAILY What changed:  how much to take how to take this when to take this additional instructions               Durable Medical Equipment  (From admission, onward)           Start     Ordered   11/26/21 0957  For home use only DME Crutches  Once        11/26/21 0956              Discharge Care Instructions  (From admission, onward)           Start     Ordered    11/26/21 0000  If the dressing is still on your incision site when you go home, remove it on the third day after your surgery date. Remove dressing if it begins to fall off, or if it is dirty or damaged before the third day.        11/26/21 1655            Disposition and follow-up:   Ms.Jeff Ross was discharged from [facility] in Good condition.  At the hospital follow up visit please address:  1.  Right Leg Cellulitis, Septic Pre-patellar Bursitis: check right knee for wound healing and resolution of infection  2.  Labs / imaging needed at time of follow-up: CBC  3.  Pending labs/ test needing follow-up: none  Follow-up Appointments:  Follow-up Information     [name], [name], MD Follow up in 2 week(s).   Specialty: Orthopedic Surgery Why: For suture removal, For wound re-check Contact information: [address] [place] [address] [phone]         [name], MD. Schedule an appointment as soon as possible for a visit in 2 week(s).   Specialty: Infectious Diseases Contact information: [address] [place] [address] [phone]  Hospital Course by problem list: Right Knee Cellulitis Right Septic Prepatellar Bursitis Patient presented with worsening cellulitis of right knee. Warm to the touch and diffuse erythema streaking up her thigh. Labs significant for leukocytosis. CT R knee showed subcutaneous edema with focal hypodense collection anterior to knee with no subcutaneous air. Edema extends into intramuscular septa, concern for necrotizing fasciitis. Ortho did irrigation and drainage. During her hospital course, noted improvement in cellulitis with IV antibiotic therapy. Culture from bursa/abscess grew MRSA which was treated with vancomycin. On day of discharge, she was feeling well and able to ambulate with crutches with PT. Transition antibiotic therapy to oral Linezolid for  total 4 weeks and outpatient follow-up with ID and Orthopedics.   HIV Last viral load <20 on 7/3. CD4 count was 514 on 4/3. Controlled with Biktarvy and Prezcobix. She follows with Rexene Alberts from ID.    Hypokalemia Hyponatremia ED labs showed potassium at 3.3. Sodium was 130. Magnesium is 1.8. Patient has not been eating. Likely she is hypovolemic. Replete potassium during her hospital course and last potassium was 3.8. Sodium improved to 135.     Discharge Exam:   BP 107/60 (BP Location: Right Arm)   Pulse (!) 54   Temp 98.1 F (36.7 C) (Oral)   Resp 18   Ht 5\' 11"  (1.803 m)   Wt 81.6 kg   SpO2 100%   BMI 25.10 kg/m   General: alert, patient is resting comfortably in bed, in no acute distress  Head: Normocephalic, atraumatic  Neck: Supple Cardio: Regular rate and rhythm Neuro: alert and oriented x 3.   Skin: warm and dry Extremities: Right LE has new bandage over it   Pertinent Labs, Studies, and Procedures:  CT Knee Right w Contrast: IMPRESSION: 1. Subcutaneous edema and skin thickening in the anterior aspect of the knee. There is a focal hypodense collection anterior to the knee in the subcutaneous soft tissues with no abnormal enhancement or subcutaneous air, possible prepatellar bursitis/infection. Edema extends into the intramuscular septa in the possibility of necrotizing fasciitis can not be completely excluded. 2. Trace joint effusion. 3. Mild degenerative changes in the medial and patellofemoral compartments with no acute osseous abnormality.    Discharge Instructions: Discharge Instructions     Call MD for:  difficulty breathing, headache or visual disturbances   Complete by: As directed    Call MD for:  extreme fatigue   Complete by: As directed    Call MD for:  hives   Complete by: As directed    Call MD for:  persistant dizziness or light-headedness   Complete by: As directed    Call MD for:  persistant nausea and vomiting   Complete by: As  directed    Call MD for:  redness, tenderness, or signs of infection (pain, swelling, redness, odor or green/yellow discharge around incision site)   Complete by: As directed    Call MD for:  severe uncontrolled pain   Complete by: As directed    Call MD for:  temperature >100.4   Complete by: As directed    Diet - low sodium heart healthy   Complete by: As directed    If the dressing is still on your incision site when you go home, remove it on the third day after your surgery date. Remove dressing if it begins to fall off, or if it is dirty or damaged before the third day.   Complete by: As directed    Increase activity slowly  Complete by: As directed        Signed: Rana Snare, DO 11/26/2021, 5:10 PM   Pager: [phone]

## 2021-11-26 NOTE — TOC Benefit Eligibility Note (Signed)
Transition of Care [facility]) Benefit Eligibility Note    Patient Details  Name: Jeff Ross MRN: [id number] Date of Birth: 02-13-1981   Medication/Dose: LINEZOLID  600 MG BID  Covered?: Yes     Prescription Coverage [facility]erred [facility]: [facility] with Person/Company/Phone Number:: JAMIE @ [employer] # [phone]  Co-Pay: $23.95  Prior Approval: No  Deductible: Unmet (OUT-OF-POCKET:UNMET)       Mardene Sayer Phone Number: 11/26/2021, 3:23 PM

## 2021-11-26 NOTE — Discharge Instructions (Addendum)
You were hospitalized for right leg cellulitis and septic pre-patellar bursitis. Orthopedics was able to irrigate and drain the bursitis. Cellulitis has improved with antibiotic treatment. You are medically stable. Please follow-up with Infectious Disease for repeat blood work and Orthopedics to check on your right knee. Thank you for allowing [place] to be part of your care.   Please note these changes made to your medications:   Please START taking:  -Linezolid 600 mg twice daily for 24 days  Please make sure to follow-up with: Rexene Alberts from Infectious Disease in 2 weeks and Orthopedics in 2 weeks.   Please call our clinic if you have any questions or concerns, we may be able to help and keep you from a long and expensive emergency room wait. Our clinic and after hours phone number is [phone], the best time to call is Monday through Friday 9 am to 4 pm but there is always someone available 24/7 if you have an emergency. If you need medication refills please notify your pharmacy one week in advance and they will send [place] a request.

## 2021-11-28 LAB — CULTURE, BLOOD (ROUTINE X 2)
Culture: NO GROWTH
Culture: NO GROWTH
Special Requests: ADEQUATE

## 2021-11-28 LAB — AEROBIC/ANAEROBIC CULTURE W GRAM STAIN (SURGICAL/DEEP WOUND)

## 2021-11-29 LAB — AEROBIC/ANAEROBIC CULTURE W GRAM STAIN (SURGICAL/DEEP WOUND)

## 2022-08-25 ENCOUNTER — Ambulatory Visit: Payer: Self-pay | Admitting: Infectious Diseases

## 2022-10-02 ENCOUNTER — Other Ambulatory Visit (HOSPITAL_COMMUNITY): Payer: Self-pay

## 2022-10-07 ENCOUNTER — Ambulatory Visit: Payer: Self-pay | Admitting: Family

## 2022-12-20 ENCOUNTER — Other Ambulatory Visit: Payer: Self-pay

## 2022-12-20 ENCOUNTER — Emergency Department (HOSPITAL_COMMUNITY)
Admission: EM | Admit: 2022-12-20 | Discharge: 2022-12-20 | Disposition: A | Discharge status: Home / Self Care | Payer: 59 | Attending: Emergency Medicine | Admitting: Emergency Medicine

## 2022-12-20 ENCOUNTER — Encounter (HOSPITAL_COMMUNITY): Payer: Self-pay

## 2022-12-20 DIAGNOSIS — R21 Rash and other nonspecific skin eruption: Secondary | ICD-10-CM | POA: Insufficient documentation

## 2022-12-20 DIAGNOSIS — B20 Human immunodeficiency virus [HIV] disease: Secondary | ICD-10-CM | POA: Insufficient documentation

## 2022-12-20 LAB — COMPREHENSIVE METABOLIC PANEL
ALT: 17 U/L (ref 0–44)
AST: 18 U/L (ref 15–41)
Albumin: 3.5 g/dL (ref 3.5–5.0)
Alkaline Phosphatase: 111 U/L (ref 38–126)
Anion gap: 10 (ref 5–15)
BUN: 9 mg/dL (ref 6–20)
CO2: 26 mmol/L (ref 22–32)
Calcium: 8.7 mg/dL — ABNORMAL LOW (ref 8.9–10.3)
Chloride: 100 mmol/L (ref 98–111)
Creatinine, Ser: 0.86 mg/dL (ref 0.61–1.24)
GFR, Estimated: >60 mL/min (ref >60)
Glucose, Bld: 109 mg/dL — ABNORMAL HIGH (ref 70–99)
Potassium: 4.3 mmol/L (ref 3.5–5.1)
Sodium: 136 mmol/L (ref 135–145)
Total Bilirubin: 0.4 mg/dL (ref 0.3–1.2)
Total Protein: 8.6 g/dL — ABNORMAL HIGH (ref 6.5–8.1)

## 2022-12-20 LAB — CBC WITH DIFFERENTIAL/PLATELET
Abs Immature Granulocytes: 0.02 10*3/uL (ref 0.00–0.07)
Basophils Absolute: 0 10*3/uL (ref 0.0–0.1)
Basophils Relative: 1 %
Eosinophils Absolute: 0.3 10*3/uL (ref 0.0–0.5)
Eosinophils Relative: 5 %
HCT: 43.8 % (ref 39.0–52.0)
Hemoglobin: 13.6 g/dL (ref 13.0–17.0)
Immature Granulocytes: 0 %
Lymphocytes Relative: 20 %
Lymphs Abs: 1.1 10*3/uL (ref 0.7–4.0)
MCH: 32.1 pg (ref 26.0–34.0)
MCHC: 31.1 g/dL (ref 30.0–36.0)
MCV: 103.3 fL — ABNORMAL HIGH (ref 80.0–100.0)
Monocytes Absolute: 0.5 10*3/uL (ref 0.1–1.0)
Monocytes Relative: 9 %
Neutro Abs: 3.4 10*3/uL (ref 1.7–7.7)
Neutrophils Relative %: 65 %
Platelets: 354 10*3/uL (ref 150–400)
RBC: 4.24 MIL/uL (ref 4.22–5.81)
RDW: 11.3 % — ABNORMAL LOW (ref 11.5–15.5)
WBC: 5.3 10*3/uL (ref 4.0–10.5)
nRBC: 0 % (ref 0.0–0.2)

## 2022-12-20 MED ORDER — PENICILLIN G BENZATHINE 1200000 UNIT/2ML IM SUSY
2.4000 10*6.[IU] | PREFILLED_SYRINGE | Freq: Once | INTRAMUSCULAR | Status: AC
  Administered 2022-12-20: 2.4 10*6.[IU] via INTRAMUSCULAR
  Filled 2022-12-20: qty 4

## 2022-12-20 MED ORDER — STERILE WATER FOR INJECTION IJ SOLN
INTRAMUSCULAR | Status: AC
  Administered 2022-12-20: 10 mL
  Filled 2022-12-20: qty 10

## 2022-12-20 MED ORDER — CEFTRIAXONE SODIUM 500 MG IJ SOLR
500.0000 mg | Freq: Once | INTRAMUSCULAR | Status: AC
  Administered 2022-12-20: 500 mg via INTRAMUSCULAR
  Filled 2022-12-20: qty 500

## 2022-12-20 MED ORDER — DOXYCYCLINE HYCLATE 100 MG PO CAPS: 100.0000 mg | ORAL_CAPSULE | Freq: Two times a day (BID) | ORAL | 0 refills | Status: AC

## 2022-12-20 NOTE — ED Provider Notes (Signed)
[place] EMERGENCY DEPARTMENT AT [facility] Provider Note   CSN: [id number] Arrival date & time: 12/20/22  1557     History Chief Complaint  Patient presents with   Rash    Jeff Ross is a 42 y.o. adult with HIV currently not taking any medication for the past year presents to the emergency room today for evaluation of rash on the groin and perineum area for the past week.  They report that they have been sexually active with the same person.  Denies any fevers or any trouble with defecation or urination.  Reports that the rash is both painful and itchy.  Denies any allergies to any medications.   Rash Associated symptoms: no abdominal pain and no fever        Home Medications Prior to Admission medications   Medication Sig Start Date End Date Taking? Authorizing Provider  doxycycline (VIBRAMYCIN) 100 MG capsule Take 1 capsule (100 mg total) by mouth 2 (two) times daily. 12/20/22  Yes Achille Rich, PA-C  bictegravir-emtricitabine-tenofovir AF (BIKTARVY) 50-200-25 MG TABS tablet Take 1 tablet by mouth daily. Patient taking differently: Take 1 tablet by mouth daily with lunch. 10/20/21   Blanchard Kelch, NP  darunavir-cobicistat (PREZCOBIX) 800-150 MG tablet Take 1 tablet by mouth daily with breakfast. Swallow whole. Take with food. Patient taking differently: Take 1 tablet by mouth daily with lunch. Swallow whole. Take with food. 10/20/21   Blanchard Kelch, NP  estradiol (VIVELLE-DOT) 0.1 MG/24HR patch APPLY 2 PATCHES(0.2 MG) EXTERNALLY TO THE SKIN 2 TIMES A WEEK Patient taking differently: Place 0.2 patches onto the skin 2 (two) times a week. 07/21/21   Blanchard Kelch, NP  spironolactone (ALDACTONE) 100 MG tablet TAKE 1 TABLET(100 MG) BY MOUTH DAILY Patient taking differently: Take 100 mg by mouth daily. 07/21/21   Blanchard Kelch, NP      Allergies    Patient has no known allergies.    Review of Systems   Review of Systems  Constitutional:  Negative for  fever.  Gastrointestinal:  Negative for abdominal pain, anal bleeding, blood in stool, constipation and rectal pain.  Genitourinary:  Negative for hematuria, penile discharge, penile pain and penile swelling.  Skin:  Positive for rash.    Physical Exam Updated Vital Signs BP (!) 106/55   Pulse 65   Temp 97.9 F (36.6 C) (Oral)   Resp 18   SpO2 100%  Physical Exam Vitals and nursing note reviewed. Exam conducted with a chaperone present Lyda Perone, EMT-P).  Constitutional:      General: She is not in acute distress.    Appearance: Normal appearance. She is not ill-appearing or toxic-appearing.  Eyes:     General: No scleral icterus. Pulmonary:     Effort: Pulmonary effort is normal. No respiratory distress.  Genitourinary:    Pubic Area: Rash present.     Comments: There is a yeastlike rash noted to the base of the penis.  There is a verrucas, raised rash with some yellow serous discharge noted at the more superior aspect of the base of the shaft of the penis.  There is also ulceration seen in the shaft of the penis and scattered throughout the scrotum and perennial and rectal area.  There is also raised wartlike projections as well.  Please see images. Skin:    General: Skin is warm and dry.  Neurological:     General: No focal deficit present.     Mental Status: She is alert. Mental  status is at baseline.  Psychiatric:        Mood and Affect: Mood normal.              ED Results / Procedures / Treatments   Labs (all labs ordered are listed, but only abnormal results are displayed) Labs Reviewed  CBC WITH DIFFERENTIAL/PLATELET - Abnormal; Notable for the following components:      Result Value   MCV 103.3 (*)    RDW 11.3 (*)    All other components within normal limits  COMPREHENSIVE METABOLIC PANEL - Abnormal; Notable for the following components:   Glucose, Bld 109 (*)    Calcium 8.7 (*)    Total Protein 8.6 (*)    All other components within normal limits   CULTURE, BLOOD (ROUTINE X 2)  CULTURE, BLOOD (ROUTINE X 2)  RPR  T-HELPER CELLS (CD4) COUNT (NOT AT [facility])  GC/CHLAMYDIA PROBE AMP ([place]) NOT AT [facility]    EKG None  Radiology No results found.  Procedures Procedures   Medications Ordered in ED Medications  penicillin g benzathine (BICILLIN LA) 1200000 UNIT/2ML injection 2.4 Million Units (has no administration in time range)  cefTRIAXone (ROCEPHIN) injection 500 mg (has no administration in time range)    ED Course/ Medical Decision Making/ A&P                               Medical Decision Making Amount and/or Complexity of Data Reviewed Labs: ordered.  Risk Prescription drug management.  42 y.o. adult presents to the ER for evaluation of rash. Differential diagnosis includes but is not limited to condyloma, STD, Kaposi's spots, shingles, SJS, TENS. Vital signs blood pressure 106/85 otherwise unremarkable. Physical exam as noted above.   It appears that patient has not followed up with infectious disease since July 2023.  From reading that note, the patient's had spotty compliance with their medications.  They reported not to the medication over a year because they have been busy.  I independently reviewed and interpreted the patient's labs.  CMP showed glucose at 109 with a mildly decreased calcium at 8.7 and a mild increase total protein 8.6, otherwise no electrolyte or LFT abnormality.  CBC without leukocytosis or anemia.  CD4 count and RPR in process.  Blood cultures in process as well. GC pending.   I consulted Infectious Disease and spoke with Dr. Drue Second.  She has reviewed the images.  She would like to follow-up with the patient in clinic.  Recommends treatment for syphilis, gonorrhea, and chlamydia.  I have confirmed the patient's phone number with them and this is accurate.  I have educated to take the medication as prescribed and follow-up with the clinic as they will need to have repeat Bicillin shots for  treatment of syphilis.  The doxycycline for the chlamydia will also help with any cellulitis present as well.  He they are not febrile nor did they have any increase in white blood cell count.  I do not see any need to admit them to the hospital and can follow-up outpatient with infectious disease.  We discussed the results of the labs/imaging. The plan is take medication as prescribed, and follow-up with infectious disease. We discussed strict return precautions and red flag symptoms. The patient verbalized their understanding and agrees to the plan. The patient is stable and being discharged home in good condition.  Portions of this report may have been transcribed using voice recognition  software. Every effort was made to ensure accuracy; however, inadvertent computerized transcription errors may be present.    Final Clinical Impression(s) / ED Diagnoses Final diagnoses:  Rash    Rx / DC Orders ED Discharge Orders          Ordered    doxycycline (VIBRAMYCIN) 100 MG capsule  2 times daily        12/20/22 2225              Achille Rich, PA-C 12/20/22 2242    Terrilee Files, MD 12/21/22 1045
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
## 2022-12-20 NOTE — Discharge Instructions (Addendum)
You were seen in the ER for evaluation of your rash.  I have discussed this case with infectious disease and any think this may be related to your HIV.  I am treating you for syphilis and gonorrhea and chlamydia.  Please see the medications as prescribed and complete the entirety of the course.  You will likely need to have repeat penicillin shots every week for the next 3 weeks.  Infectious disease would like to see you again.  I included information for them in the discharge paperwork.  Please make sure you call to schedule an appointment.  The meantime, I would abstain from any sexual intercourse as you may re-infect yourself. If you have any concerns, new or worsening symptoms, please return to your nearest emergency department for reevaluation.  Contact a health care provider if: You sweat at night more than normal. You pee (urinate) more or less than normal, or your pee is a darker color than normal. Your eyes become sensitive to light. Your skin or the white parts of your eyes turn yellow (jaundice). Your skin tingles or is numb. You get painful blisters in your nose or mouth. Your rash does not go away after a few days, or it gets worse. You are more tired or thirsty than normal. You have new or worse symptoms. These may include: Pain in your abdomen. Fever. Diarrhea or vomiting. Weakness or weight loss. Get help right away if: You get confused. You have a severe headache, a stiff neck, or severe joint pain or stiffness. You become very sleepy or not responsive. You have a seizure.

## 2022-12-21 LAB — RPR
RPR Ser Ql: REACTIVE — AB
RPR Titer: >=1:256 {titer}

## 2022-12-22 LAB — T-HELPER CELLS (CD4) COUNT (NOT AT ARMC)

## 2022-12-23 LAB — T.PALLIDUM AB, TOTAL: T Pallidum Abs: REACTIVE — AB

## 2022-12-24 LAB — GC/CHLAMYDIA PROBE AMP (~~LOC~~) NOT AT ARMC
Chlamydia: NEGATIVE
Comment: NEGATIVE
Comment: NORMAL
Neisseria Gonorrhea: NEGATIVE

## 2022-12-25 LAB — CULTURE, BLOOD (ROUTINE X 2)
Culture: NO GROWTH
Culture: NO GROWTH
Special Requests: ADEQUATE
Special Requests: ADEQUATE

## 2023-03-02 DIAGNOSIS — Z720 Tobacco use: Secondary | ICD-10-CM | POA: Diagnosis not present

## 2023-03-02 DIAGNOSIS — B20 Human immunodeficiency virus [HIV] disease: Secondary | ICD-10-CM | POA: Diagnosis not present

## 2023-03-02 DIAGNOSIS — Z833 Family history of diabetes mellitus: Secondary | ICD-10-CM | POA: Diagnosis not present

## 2023-03-02 DIAGNOSIS — I10 Essential (primary) hypertension: Secondary | ICD-10-CM | POA: Diagnosis not present

## 2023-08-09 ENCOUNTER — Other Ambulatory Visit: Payer: Self-pay

## 2023-08-09 ENCOUNTER — Encounter (HOSPITAL_COMMUNITY): Payer: Self-pay | Admitting: *Deleted

## 2023-08-09 ENCOUNTER — Emergency Department (HOSPITAL_COMMUNITY)
Admission: EM | Admit: 2023-08-09 | Discharge: 2023-08-09 | Payer: Self-pay | Attending: Emergency Medicine | Admitting: Emergency Medicine

## 2023-08-09 DIAGNOSIS — M795 Residual foreign body in soft tissue: Secondary | ICD-10-CM | POA: Insufficient documentation
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
## 2023-08-26 ENCOUNTER — Telehealth: Payer: Self-pay

## 2023-08-26 NOTE — Telephone Encounter (Signed)
 Patient considered out of care.  Last [facility] Visit: 10/20/21  Last HIV Viral Load:  HIV 1 RNA Quant  Date Value Ref Range Status  10/20/2021 <20 (H) Copies/mL Final    Comment:    HIV-1 RNA Detected    Last CD4 Count:  CD4  Date Value Ref Range Status  05/02/2018 777 359 - 1,519 /uL Final   CD4 T Cell Abs  Date Value Ref Range Status  12/20/2022  400 - 1790 /uL Final   CANCELED BY LAB, SPECIMEN DOES NOT MEET COLLECTION REQUIREMENTS    Medication Dispense History:   Dispensed Days Supply Quantity Provider Pharmacy   BIKTARVY  50/200/25MG  TABLETS 11/12/2021 30 30 each Sonya Duster, RPH-CPP [facility] DR[facility] STORE #...  BIKTARVY  50/200/25MG  TABLETS 10/16/2021 30 30 each Sonya Duster, RPH-CPP [facility] DRUG STORE #...  BIKTARVY  50/200/25MG  TABLETS 06/07/2021 30 30 each Sonya Duster, RPH-CPP [facility] DRUG STORE #...    Dispensed Days Supply Quantity Provider Pharmacy  PREZCOBIX  800MG -150MG  TABLETS 11/12/2021 30 30 each Sonya Duster, RPH-CPP [facility] DRUG STORE #...  PREZCOBIX  800MG -150MG  TABLETS 10/16/2021 30 30 each Sonya Duster, RPH-CPP [facility] DRUG STORE #...  PREZCOBIX  800MG -150MG  TABLETS 06/07/2021 30 30 each Sonya Duster, RPH-CPP [facility] DRUG STORE #...    Interventions: Called [name] to offer appointment, no answer and unable to leave message.   Duration of Services: 5 minutes  Wynona Duhamel, BSN, Charity fundraiser

## 2023-08-31 NOTE — Progress Notes (Signed)
 The 10-year ASCVD risk score (Arnett DK, et al., 2019) is: 8.5%   Values used to calculate the score:     Age: 43 years     Sex: Male     Is Non-Hispanic African American: Yes     Diabetic: No     Tobacco smoker: Yes     Systolic Blood Pressure: 112 mmHg     Is BP treated: Yes     HDL Cholesterol: 38 mg/dL     Total Cholesterol: 149 mg/dL  Arlon Bergamo, BSN, RN

## 2023-09-20 ENCOUNTER — Ambulatory Visit (INDEPENDENT_AMBULATORY_CARE_PROVIDER_SITE_OTHER): Admitting: Infectious Diseases

## 2023-09-20 ENCOUNTER — Other Ambulatory Visit: Payer: Self-pay

## 2023-09-20 VITALS — BP 141/80 | HR 64

## 2023-09-20 DIAGNOSIS — B20 Human immunodeficiency virus [HIV] disease: Secondary | ICD-10-CM | POA: Diagnosis not present

## 2023-09-20 DIAGNOSIS — Z23 Encounter for immunization: Secondary | ICD-10-CM

## 2023-09-20 MED ORDER — BICTEGRAVIR-EMTRICITAB-TENOFOV 50-200-25 MG PO TABS: 1.0000 | ORAL_TABLET | Freq: Every day | ORAL | 5 refills | Status: AC
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
## 2023-09-20 NOTE — Addendum Note (Signed)
 Addended by: Pranay Hilbun M on: 09/20/2023 10:52 AM   Modules accepted: Orders

## 2023-09-21 LAB — T-HELPER CELLS (CD4) COUNT (NOT AT ARMC)
CD4 % Helper T Cell: 21 % — ABNORMAL LOW (ref 33–65)
CD4 T Cell Abs: 239 /uL — ABNORMAL LOW (ref 400–1790)

## 2023-09-30 LAB — CBC
HCT: 37.3 % — ABNORMAL LOW (ref 38.5–50.0)
Hemoglobin: 12.2 g/dL — ABNORMAL LOW (ref 13.2–17.1)
MCH: 32.7 pg (ref 27.0–33.0)
MCHC: 32.7 g/dL (ref 32.0–36.0)
MCV: 100 fL (ref 80.0–100.0)
MPV: 9.7 fL (ref 7.5–12.5)
Platelets: 245 10*3/uL (ref 140–400)
RBC: 3.73 10*6/uL — ABNORMAL LOW (ref 4.20–5.80)
RDW: 11.1 % (ref 11.0–15.0)
WBC: 3.6 10*3/uL — ABNORMAL LOW (ref 3.8–10.8)

## 2023-09-30 LAB — COMPLETE METABOLIC PANEL WITHOUT GFR
AG Ratio: 1.1 (calc) (ref 1.0–2.5)
ALT: 19 U/L (ref 9–46)
AST: 17 U/L (ref 10–40)
Albumin: 4.2 g/dL (ref 3.6–5.1)
Alkaline phosphatase (APISO): 95 U/L (ref 36–130)
BUN: 10 mg/dL (ref 7–25)
CO2: 29 mmol/L (ref 20–32)
Calcium: 9 mg/dL (ref 8.6–10.3)
Chloride: 104 mmol/L (ref 98–110)
Creat: 0.88 mg/dL (ref 0.60–1.29)
Globulin: 3.8 g/dL — ABNORMAL HIGH (ref 1.9–3.7)
Glucose, Bld: 90 mg/dL (ref 65–99)
Potassium: 3.7 mmol/L (ref 3.5–5.3)
Sodium: 139 mmol/L (ref 135–146)
Total Bilirubin: 0.4 mg/dL (ref 0.2–1.2)
Total Protein: 8 g/dL (ref 6.1–8.1)

## 2023-09-30 LAB — HIV RNA, RTPCR W/R GT (RTI, PI,INT)
HIV 1 RNA Quant: 71700 {copies}/mL — ABNORMAL HIGH
HIV-1 RNA Quant, Log: 4.86 {Log_copies}/mL — ABNORMAL HIGH

## 2023-09-30 LAB — HIV-1 INTEGRASE GENOTYPE

## 2023-09-30 LAB — T PALLIDUM AB: T Pallidum Abs: POSITIVE — AB

## 2023-09-30 LAB — RPR TITER: RPR Titer: 1:16 {titer} — ABNORMAL HIGH

## 2023-09-30 LAB — RPR: RPR Ser Ql: REACTIVE — AB

## 2023-09-30 LAB — HIV-1 GENOTYPE: HIV-1 Genotype: DETECTED — AB

## 2023-12-09 ENCOUNTER — Encounter: Payer: Self-pay | Admitting: Infectious Diseases

## 2023-12-09 ENCOUNTER — Other Ambulatory Visit: Payer: Self-pay

## 2023-12-09 ENCOUNTER — Ambulatory Visit (INDEPENDENT_AMBULATORY_CARE_PROVIDER_SITE_OTHER): Admitting: Infectious Diseases

## 2023-12-09 VITALS — BP 121/82 | HR 54 | Temp 98.1°F

## 2023-12-09 DIAGNOSIS — Z23 Encounter for immunization: Secondary | ICD-10-CM | POA: Diagnosis not present

## 2023-12-09 DIAGNOSIS — B20 Human immunodeficiency virus [HIV] disease: Secondary | ICD-10-CM | POA: Diagnosis not present

## 2023-12-09 NOTE — Assessment & Plan Note (Signed)
 VL at last check with lapse in medications 77,000+ copies and CD4 239. Discussed today  Will update VL after 2 months back on consistent ART.  Menveo #2 today  RTC in 3 m or upon release to coordinate outpatient medication fills.

## 2023-12-09 NOTE — Progress Notes (Signed)
 Name: Jeff Ross  DOB: 07/01/1980 MRN: [id number] PCP: Patient, No Pcp Per    Brief Narrative:  Jeff Ross is a 43 y.o. 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   History of Present Illness      Here for 77m follow up from [facility] DT with 2 guards. She feels better after getting back on her biktarvy  the last 2 months now.  More energy, weight gain which feels better.  Hopeful to be released after court hearing in 28 days.   She deferred answering depression/anxiety screenings today     Review of Systems  Constitutional:  Negative for chills, fever, malaise/fatigue and weight loss.  HENT:  Negative for sore throat.   Respiratory:  Negative for cough, sputum production and shortness of breath.   Cardiovascular: Negative.   Gastrointestinal:  Negative for abdominal pain, diarrhea and vomiting.  Musculoskeletal:  Negative for joint pain, myalgias and neck pain.  Skin:  Negative for rash.  Neurological:  Negative for headaches.  Psychiatric/Behavioral:  Negative for depression and substance abuse. The patient is not nervous/anxious.      Past Medical History:  Diagnosis Date   HIV (human immunodeficiency virus infection) (HCC)    Transgender     Outpatient Medications Prior to Visit  Medication Sig Dispense Refill   bictegravir-emtricitabine -tenofovir  AF (BIKTARVY ) 50-200-25 MG TABS tablet Take 1 tablet by mouth daily. 30 tablet 5   doxycycline  (VIBRAMYCIN ) 100 MG capsule Take 1 capsule (100 mg total) by  mouth 2 (two) times daily. (Patient not taking: Reported on 12/09/2023) 14 capsule 0   estradiol  (VIVELLE -DOT) 0.1 MG/24HR patch APPLY 2 PATCHES(0.2 MG) EXTERNALLY TO THE SKIN 2 TIMES A WEEK (Patient taking differently: Place 0.2 patches onto the skin 2 (two) times a week.) 16 patch 4   spironolactone  (ALDACTONE ) 100 MG tablet TAKE 1 TABLET(100 MG) BY MOUTH DAILY (Patient taking differently: Take 100 mg by mouth daily.) 30 tablet 4   No facility-administered medications prior to visit.     No Known Allergies  Social History   Tobacco Use   Smoking status: Some Days    Current packs/day: 0.10    Average packs/day: 0.1 packs/day for 9.0 years (0.9 ttl pk-yrs)    Types: Cigarettes   Smokeless tobacco: Never   Tobacco comments:    cutting back  Substance Use Topics   Alcohol use: Not Currently    Alcohol/week: 2.0 standard drinks of alcohol    Types: 2 Glasses of wine per week    Comment: couple times a week   Drug use: No    Frequency: 1.0 times per week    No family history on file.  Social History   Substance and Sexual Activity  Sexual Activity Yes   Partners: Male   Birth control/protection: Condom   Comment: given condoms     Objective:   Vitals:   12/09/23 1033  BP: 121/82  Pulse: (!) 54  Temp: 98.1 F (36.7 C)  TempSrc: Temporal  SpO2: 96%   There is no height or weight on file to calculate BMI.  Physical Exam Vitals reviewed.  Constitutional:      Appearance: Normal appearance. She is not ill-appearing.  HENT:     Head: Normocephalic.     Mouth/Throat:     Mouth: Mucous membranes are moist.     Pharynx: Oropharynx is clear.  Eyes:     General: No scleral icterus. Cardiovascular:     Rate and Rhythm: Normal rate and regular rhythm.  Pulmonary:     Effort: Pulmonary effort is normal.  Musculoskeletal:        General: Normal range of motion.     Cervical back: Normal range of motion.  Skin:    Coloration: Skin is not jaundiced or pale.   Neurological:     Mental Status: She is alert and oriented to person, place, and time.  Psychiatric:        Mood and Affect: Mood normal.        Judgment: Judgment normal.     Lab Results Lab Results  Component Value Date   WBC 3.6 (L) 09/20/2023   HGB 12.2 (L) 09/20/2023   HCT 37.3 (L) 09/20/2023   MCV 100.0 09/20/2023   PLT 245 09/20/2023    Lab Results  Component Value Date   CREATININE 0.88 09/20/2023   BUN 10 09/20/2023   NA 139 09/20/2023   K 3.7 09/20/2023   CL 104 09/20/2023   CO2 29 09/20/2023    Lab Results  Component Value Date   ALT 19 09/20/2023   AST 17 09/20/2023   ALKPHOS 111 12/20/2022   BILITOT 0.4 09/20/2023    Lab Results  Component Value Date   CHOL 149 05/29/2021   HDL 38 (L) 05/29/2021   LDLCALC 91 05/29/2021   TRIG 106 05/29/2021   CHOLHDL 3.9 05/29/2021   HIV 1 RNA Quant  Date Value  09/20/2023 71,700 copies/mL (H)  10/20/2021 <20 Copies/mL (H)  07/21/2021 Not Detected Copies/mL   HIV-1 RNA Viral Load (no units)  Date Value  01/24/2019 <40  05/02/2018 <40   CD4 (/uL)  Date Value  05/02/2018 777   CD4 T Cell Abs (/uL)  Date Value  09/20/2023 239 (L)  12/20/2022    CANCELED BY LAB, SPECIMEN DOES NOT MEET COLLECTION REQUIREMENTS  07/21/2021 514     Assessment & Plan:   Problem List Items Addressed This Visit       Unprioritized   Human immunodeficiency virus (HIV) disease (HCC)   VL at last check with lapse in medications 77,000+ copies and CD4 239. Discussed today  Will update VL after 2 months back on consistent ART.  Menveo #2 today  RTC in 3 m or upon release to coordinate outpatient medication fills.       Need for meningococcal vaccination   Relevant Orders   MENINGOCOCCAL MCV4O (Completed)   Other Visit Diagnoses       HIV disease (HCC)    -  Primary   Relevant Orders   HIV 1 RNA quant-no reflex-bld   T-helper cells (CD4) count   MENINGOCOCCAL MCV4O (Completed)        No orders of the defined  types were placed in this encounter.  Orders Placed This Encounter  Procedures   MENINGOCOCCAL MCV4O   HIV 1 RNA quant-no reflex-bld   T-helper cells (CD4) count     Jeff Fireman, MSN, NP-C [facility] Pager: [phone]  Office: [phone]  12/09/23  12:56 PM

## 2023-12-10 LAB — T-HELPER CELLS (CD4) COUNT (NOT AT ARMC)
CD4 % Helper T Cell: 27 % — ABNORMAL LOW (ref 33–65)
CD4 T Cell Abs: 414 /uL (ref 400–1790)

## 2023-12-11 LAB — HIV-1 RNA QUANT-NO REFLEX-BLD
HIV 1 RNA Quant: NOT DETECTED {copies}/mL
HIV-1 RNA Quant, Log: NOT DETECTED {Log_copies}/mL

## 2023-12-14 ENCOUNTER — Ambulatory Visit: Payer: Self-pay | Admitting: Infectious Diseases

## 2024-03-07 ENCOUNTER — Ambulatory Visit: Admitting: Infectious Diseases
